# Patient Record
Sex: Female | Born: 1965 | Race: White | Hispanic: No | Marital: Married | Smoking: Never smoker
Health system: Southern US, Community
[De-identification: ages and names within clinical notes are randomized; demographics above are authoritative.]

## PROBLEM LIST (undated history)

## (undated) DIAGNOSIS — F419 Anxiety disorder, unspecified: Secondary | ICD-10-CM

## (undated) DIAGNOSIS — L509 Urticaria, unspecified: Secondary | ICD-10-CM

## (undated) DIAGNOSIS — G894 Chronic pain syndrome: Secondary | ICD-10-CM

## (undated) DIAGNOSIS — F32A Depression, unspecified: Secondary | ICD-10-CM

## (undated) DIAGNOSIS — M961 Postlaminectomy syndrome, not elsewhere classified: Secondary | ICD-10-CM

## (undated) DIAGNOSIS — J45909 Unspecified asthma, uncomplicated: Secondary | ICD-10-CM

## (undated) DIAGNOSIS — F329 Major depressive disorder, single episode, unspecified: Secondary | ICD-10-CM

## (undated) HISTORY — DX: Chronic pain syndrome: G89.4

## (undated) HISTORY — PX: ABDOMINAL HYSTERECTOMY: SHX81

## (undated) HISTORY — PX: OTHER SURGICAL HISTORY: SHX169

## (undated) HISTORY — DX: Depression, unspecified: F32.A

## (undated) HISTORY — DX: Unspecified asthma, uncomplicated: J45.909

## (undated) HISTORY — PX: POLYPECTOMY: SHX149

## (undated) HISTORY — PX: CHOLECYSTECTOMY: SHX55

## (undated) HISTORY — DX: Major depressive disorder, single episode, unspecified: F32.9

## (undated) HISTORY — PX: HERNIA REPAIR: SHX51

## (undated) HISTORY — PX: SPINE SURGERY: SHX786

## (undated) HISTORY — PX: TONSILECTOMY, ADENOIDECTOMY, BILATERAL MYRINGOTOMY AND TUBES: SHX2538

## (undated) HISTORY — PX: GASTRIC BYPASS: SHX52

## (undated) HISTORY — DX: Anxiety disorder, unspecified: F41.9

## (undated) HISTORY — DX: Postlaminectomy syndrome, not elsewhere classified: M96.1

## (undated) HISTORY — DX: Urticaria, unspecified: L50.9

---

## 1998-06-25 ENCOUNTER — Other Ambulatory Visit: Admission: RE | Admit: 1998-06-25 | Discharge: 1998-06-25 | Payer: Self-pay | Admitting: Obstetrics and Gynecology

## 1999-06-28 ENCOUNTER — Other Ambulatory Visit: Admission: RE | Admit: 1999-06-28 | Discharge: 1999-06-28 | Payer: Self-pay | Admitting: Obstetrics and Gynecology

## 2000-11-20 ENCOUNTER — Other Ambulatory Visit: Admission: RE | Admit: 2000-11-20 | Discharge: 2000-11-20 | Payer: Self-pay | Admitting: Obstetrics and Gynecology

## 2001-02-15 ENCOUNTER — Encounter: Admission: RE | Admit: 2001-02-15 | Discharge: 2001-05-16 | Payer: Self-pay | Admitting: Surgical Oncology

## 2001-04-04 ENCOUNTER — Encounter: Payer: Self-pay | Admitting: Obstetrics and Gynecology

## 2001-04-04 ENCOUNTER — Encounter: Admission: RE | Admit: 2001-04-04 | Discharge: 2001-04-04 | Payer: Self-pay | Admitting: Obstetrics and Gynecology

## 2001-04-06 ENCOUNTER — Other Ambulatory Visit: Admission: RE | Admit: 2001-04-06 | Discharge: 2001-04-06 | Payer: Self-pay | Admitting: Obstetrics and Gynecology

## 2001-04-06 ENCOUNTER — Encounter (INDEPENDENT_AMBULATORY_CARE_PROVIDER_SITE_OTHER): Payer: Self-pay

## 2004-03-02 ENCOUNTER — Other Ambulatory Visit: Admission: RE | Admit: 2004-03-02 | Discharge: 2004-03-02 | Payer: Self-pay | Admitting: Obstetrics and Gynecology

## 2005-03-03 ENCOUNTER — Other Ambulatory Visit: Admission: RE | Admit: 2005-03-03 | Discharge: 2005-03-03 | Payer: Self-pay | Admitting: Obstetrics and Gynecology

## 2005-03-16 ENCOUNTER — Ambulatory Visit (HOSPITAL_COMMUNITY): Admission: RE | Admit: 2005-03-16 | Discharge: 2005-03-16 | Payer: Self-pay | Admitting: Obstetrics and Gynecology

## 2006-06-21 ENCOUNTER — Other Ambulatory Visit: Admission: RE | Admit: 2006-06-21 | Discharge: 2006-06-21 | Payer: Self-pay | Admitting: Gynecology

## 2006-07-21 ENCOUNTER — Encounter (INDEPENDENT_AMBULATORY_CARE_PROVIDER_SITE_OTHER): Payer: Self-pay | Admitting: *Deleted

## 2006-07-21 ENCOUNTER — Ambulatory Visit (HOSPITAL_BASED_OUTPATIENT_CLINIC_OR_DEPARTMENT_OTHER): Admission: RE | Admit: 2006-07-21 | Discharge: 2006-07-21 | Payer: Self-pay | Admitting: Gynecology

## 2006-11-02 ENCOUNTER — Ambulatory Visit (HOSPITAL_COMMUNITY): Admission: RE | Admit: 2006-11-02 | Discharge: 2006-11-02 | Payer: Self-pay | Admitting: Obstetrics and Gynecology

## 2006-11-02 ENCOUNTER — Encounter (INDEPENDENT_AMBULATORY_CARE_PROVIDER_SITE_OTHER): Payer: Self-pay | Admitting: *Deleted

## 2007-02-13 ENCOUNTER — Ambulatory Visit (HOSPITAL_COMMUNITY): Admission: RE | Admit: 2007-02-13 | Discharge: 2007-02-14 | Payer: Self-pay | Admitting: Obstetrics & Gynecology

## 2007-02-13 ENCOUNTER — Encounter (INDEPENDENT_AMBULATORY_CARE_PROVIDER_SITE_OTHER): Payer: Self-pay | Admitting: Obstetrics & Gynecology

## 2008-09-06 ENCOUNTER — Encounter: Admission: RE | Admit: 2008-09-06 | Discharge: 2008-09-06 | Payer: Self-pay | Admitting: Neurosurgery

## 2008-09-11 ENCOUNTER — Ambulatory Visit (HOSPITAL_COMMUNITY): Admission: RE | Admit: 2008-09-11 | Discharge: 2008-09-12 | Payer: Self-pay | Admitting: Neurosurgery

## 2009-03-20 ENCOUNTER — Encounter: Admission: RE | Admit: 2009-03-20 | Discharge: 2009-03-20 | Payer: Self-pay | Admitting: Neurosurgery

## 2009-04-24 ENCOUNTER — Ambulatory Visit (HOSPITAL_COMMUNITY): Admission: RE | Admit: 2009-04-24 | Discharge: 2009-04-25 | Payer: Self-pay | Admitting: Neurosurgery

## 2009-07-31 ENCOUNTER — Encounter: Admission: RE | Admit: 2009-07-31 | Discharge: 2009-07-31 | Payer: Self-pay | Admitting: Family Medicine

## 2009-08-13 ENCOUNTER — Encounter: Admission: RE | Admit: 2009-08-13 | Discharge: 2009-08-13 | Payer: Self-pay | Admitting: General Surgery

## 2009-08-18 ENCOUNTER — Ambulatory Visit (HOSPITAL_COMMUNITY): Admission: RE | Admit: 2009-08-18 | Discharge: 2009-08-18 | Payer: Self-pay | Admitting: Surgery

## 2009-09-03 ENCOUNTER — Ambulatory Visit (HOSPITAL_COMMUNITY): Admission: RE | Admit: 2009-09-03 | Discharge: 2009-09-03 | Payer: Self-pay | Admitting: General Surgery

## 2009-09-07 ENCOUNTER — Ambulatory Visit (HOSPITAL_COMMUNITY): Admission: RE | Admit: 2009-09-07 | Discharge: 2009-09-07 | Payer: Self-pay | Admitting: General Surgery

## 2009-09-14 ENCOUNTER — Ambulatory Visit (HOSPITAL_COMMUNITY): Admission: RE | Admit: 2009-09-14 | Discharge: 2009-09-15 | Payer: Self-pay | Admitting: General Surgery

## 2010-01-08 ENCOUNTER — Encounter: Admission: RE | Admit: 2010-01-08 | Discharge: 2010-01-08 | Payer: Self-pay | Admitting: Neurosurgery

## 2010-01-29 ENCOUNTER — Inpatient Hospital Stay (HOSPITAL_COMMUNITY): Admission: RE | Admit: 2010-01-29 | Discharge: 2010-02-01 | Payer: Self-pay | Admitting: Neurosurgery

## 2010-12-14 LAB — CBC
MCHC: 35.1 g/dL (ref 30.0–36.0)
MCV: 90.1 fL (ref 78.0–100.0)
RDW: 13.3 % (ref 11.5–15.5)

## 2010-12-14 LAB — TYPE AND SCREEN
ABO/RH(D): A NEG
Antibody Screen: NEGATIVE

## 2010-12-27 LAB — CBC
HCT: 30.8 % — ABNORMAL LOW (ref 36.0–46.0)
Hemoglobin: 10.5 g/dL — ABNORMAL LOW (ref 12.0–15.0)
Hemoglobin: 12.5 g/dL (ref 12.0–15.0)
MCV: 88.3 fL (ref 78.0–100.0)
Platelets: 233 10*3/uL (ref 150–400)
WBC: 5.8 10*3/uL (ref 4.0–10.5)
WBC: 6.6 10*3/uL (ref 4.0–10.5)

## 2010-12-27 LAB — URINALYSIS, ROUTINE W REFLEX MICROSCOPIC
Bilirubin Urine: NEGATIVE
Glucose, UA: NEGATIVE mg/dL
Hgb urine dipstick: NEGATIVE
Nitrite: NEGATIVE
Protein, ur: NEGATIVE mg/dL
Specific Gravity, Urine: 1.036 — ABNORMAL HIGH (ref 1.005–1.030)

## 2010-12-27 LAB — COMPREHENSIVE METABOLIC PANEL
BUN: 12 mg/dL (ref 6–23)
GFR calc Af Amer: >60 mL/min (ref >60)
GFR calc non Af Amer: >60 mL/min (ref >60)
Glucose, Bld: 96 mg/dL (ref 70–99)
Potassium: 3.8 mEq/L (ref 3.5–5.1)
Total Bilirubin: 0.7 mg/dL (ref 0.3–1.2)
Total Protein: 7.4 g/dL (ref 6.0–8.3)

## 2010-12-27 LAB — DIFFERENTIAL
Basophils Absolute: 0 10*3/uL (ref 0.0–0.1)
Eosinophils Absolute: 0.2 10*3/uL (ref 0.0–0.7)
Eosinophils Relative: 3 % (ref 0–5)
Lymphocytes Relative: 36 % (ref 12–46)
Lymphs Abs: 2.4 10*3/uL (ref 0.7–4.0)
Neutrophils Relative %: 54 % (ref 43–77)

## 2010-12-29 LAB — URINE CULTURE: Colony Count: >=100000

## 2011-01-02 LAB — CBC
Hemoglobin: 13.1 g/dL (ref 12.0–15.0)
MCHC: 34.5 g/dL (ref 30.0–36.0)
MCV: 86.1 fL (ref 78.0–100.0)
Platelets: 293 10*3/uL (ref 150–400)
RDW: 15 % (ref 11.5–15.5)

## 2011-01-02 LAB — BASIC METABOLIC PANEL
CO2: 28 mEq/L (ref 19–32)
Creatinine, Ser: 0.77 mg/dL (ref 0.4–1.2)
GFR calc non Af Amer: >60 mL/min (ref >60)

## 2011-01-26 ENCOUNTER — Encounter: Payer: PRIVATE HEALTH INSURANCE | Attending: Neurosurgery | Admitting: Neurosurgery

## 2011-01-26 DIAGNOSIS — IMO0002 Reserved for concepts with insufficient information to code with codable children: Secondary | ICD-10-CM | POA: Insufficient documentation

## 2011-01-26 DIAGNOSIS — E669 Obesity, unspecified: Secondary | ICD-10-CM | POA: Insufficient documentation

## 2011-01-26 DIAGNOSIS — M1710 Unilateral primary osteoarthritis, unspecified knee: Secondary | ICD-10-CM

## 2011-01-26 DIAGNOSIS — M961 Postlaminectomy syndrome, not elsewhere classified: Secondary | ICD-10-CM | POA: Insufficient documentation

## 2011-01-26 DIAGNOSIS — R209 Unspecified disturbances of skin sensation: Secondary | ICD-10-CM | POA: Insufficient documentation

## 2011-01-26 DIAGNOSIS — G8929 Other chronic pain: Secondary | ICD-10-CM | POA: Insufficient documentation

## 2011-01-26 DIAGNOSIS — M545 Low back pain, unspecified: Secondary | ICD-10-CM | POA: Insufficient documentation

## 2011-01-26 DIAGNOSIS — Z9884 Bariatric surgery status: Secondary | ICD-10-CM | POA: Insufficient documentation

## 2011-01-26 DIAGNOSIS — Z981 Arthrodesis status: Secondary | ICD-10-CM | POA: Insufficient documentation

## 2011-01-26 DIAGNOSIS — F329 Major depressive disorder, single episode, unspecified: Secondary | ICD-10-CM | POA: Insufficient documentation

## 2011-01-26 DIAGNOSIS — M79609 Pain in unspecified limb: Secondary | ICD-10-CM | POA: Insufficient documentation

## 2011-01-26 DIAGNOSIS — F3289 Other specified depressive episodes: Secondary | ICD-10-CM | POA: Insufficient documentation

## 2011-01-27 NOTE — Group Therapy Note (Signed)
Mallory Silva is referred to [place] by Dr. Maeola Harman of [facility] after series of surgeries.  She had surgery with Dr. Venetia Maxon initially what she describes as a laminectomy in 2009, then a repeat in 2010, and then finally a fusion in May 2011.  Her fusion was L5-S1 decompression with PLIF and PLA.  Since that time, she has done well. She has continued on pain medicines through Dr. Fredrich Birks office until last month when he released her and referred her to [place] for evaluation.  The patient tells me her pain mainly is in her low back, in her left thigh, and left lateral leg and foot.  She does feel some numbness and tingling in the great toe on the left foot at this point in time.  She has been on OxyContin 40 mg twice a day, oxycodone 5 four times a day until now.  She states she would like to increase it, but I have explained to the patient that the goal would be to either back her down some or get her only dose and regimen that will be more suitable for her.  The patient describes some depression that is fairly obvious today in the interview.  She does have average pain of about 6.  All activities increase her pain.  Rest, pacing activities, and medications tend to help some.  She walks without assistance even though she does have somewhat of a limp.  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  REVIEW OF SYSTEMS:  Notable for bladder control problems, some weakness, and reported depression by the patient even though there is no clinical diagnosis.  Prior studies are available on PACK with a CT scan x-rays were done through [facility].  She had nerve studies done that she will try to obtain for [place], but she states she does not remember where they were.  Primary care doctor is Dr. Windle Guard,  Surgeon is Dr. Venetia Maxon.  PAST MEDICAL HISTORY:  Significant for hysterectomy in 2008, gastric bypass 2002, gallbladder in 2010, and 3 back  surgeries with Dr. Venetia Maxon in 2009, 2010, and a fusion in May 2011.  SOCIAL HISTORY:  She is married.  She lives with her husband.  She does not report any alcohol or tobacco use or illicit drug use.  FAMILY HISTORY:  Significant for diabetes, lung disease, and cancer.  PHYSICAL EXAMINATION:  CONSTITUTIONAL:  Appears to be within normal limits.  She is somewhat obese.  She is alert and oriented x3.  She appears a little bit depressed and mildly anxious today, but pleasant to talk to. VITAL SIGNS:  Blood pressure today was 146/90, pulse was 60, respirations were 20, and O2 saturation 97 on room air. NEUROLOGIC:  Motor strength appears to be 5/5 in the lower extremities including the iliopsoas, quadriceps, dorsiflexors, EHL, and plantar flexors.  Upper extremities are equal and 5/5 bilaterally including deltoid, biceps, triceps, intrinsics, grip.  Her sensation is intact to pinprick in the upper extremities, somewhat diminished in the left lower extremity along the lateral aspect of her leg.  Again, she can flex to about 60 degrees, extend about 10 degrees with pain in both directions.  Her reflexes are trace at the triceps, 1 at biceps, absent at the brachioradialis, 2 at the quadriceps, and trace at best at the EHL.  She is point tender to palpation along the incision line of the lumbar spine, but the incision appears well healed.  There is no redness whatsoever.  ASSESSMENT: 1. Postlaminectomy syndrome, chronic pain. 2. Depression. 3. Chronic radiculopathy in the left lower extremity.  PLAN: 1. She did undergo UDS today.  Per her request, we are going to go     ahead and obtain MRI with and without Gadolinium of the lumbar     spine just to rule out any further complication.  She states she     asked Dr. Venetia Maxon on 2 or 3 visits about possible further     investigation, but he did not feel it was warranted as he had     reviewed his office x-rays and felt like the fusion looked  good.     So, we will go ahead and do that per her request to see if we are     having a pathology to deal with there. 2. She understands that we will not issue medications today.  She has     pain medicines in residual from his office, asked about anti-     inflammatory, she cannot take those due to complications she has     had in the past. 3. We will see the patient back after the MRI, hopefully in the next 2     weeks.  If she has any difficulty in interval, she will give [place] a     call.     Mallory Silva    RLW/MedQ D:  01/26/2011 16:15:58  T:  01/27/2011 04:17:20  Job #:  [id number]

## 2011-02-03 ENCOUNTER — Other Ambulatory Visit: Payer: Self-pay | Admitting: Physical Medicine & Rehabilitation

## 2011-02-03 DIAGNOSIS — M545 Low back pain, unspecified: Secondary | ICD-10-CM

## 2011-02-03 DIAGNOSIS — M5416 Radiculopathy, lumbar region: Secondary | ICD-10-CM

## 2011-02-07 ENCOUNTER — Ambulatory Visit (HOSPITAL_COMMUNITY)
Admission: RE | Admit: 2011-02-07 | Discharge: 2011-02-07 | Disposition: A | Discharge status: Home / Self Care | Payer: PRIVATE HEALTH INSURANCE | Source: Ambulatory Visit | Attending: Physical Medicine & Rehabilitation | Admitting: Physical Medicine & Rehabilitation

## 2011-02-07 DIAGNOSIS — Z981 Arthrodesis status: Secondary | ICD-10-CM | POA: Insufficient documentation

## 2011-02-07 DIAGNOSIS — M545 Low back pain, unspecified: Secondary | ICD-10-CM

## 2011-02-07 DIAGNOSIS — IMO0001 Reserved for inherently not codable concepts without codable children: Secondary | ICD-10-CM | POA: Insufficient documentation

## 2011-02-07 DIAGNOSIS — M79609 Pain in unspecified limb: Secondary | ICD-10-CM | POA: Insufficient documentation

## 2011-02-07 DIAGNOSIS — M5416 Radiculopathy, lumbar region: Secondary | ICD-10-CM

## 2011-02-07 DIAGNOSIS — R209 Unspecified disturbances of skin sensation: Secondary | ICD-10-CM | POA: Insufficient documentation

## 2011-02-07 DIAGNOSIS — M533 Sacrococcygeal disorders, not elsewhere classified: Secondary | ICD-10-CM | POA: Insufficient documentation

## 2011-02-07 MED ORDER — GADOBENATE DIMEGLUMINE 529 MG/ML IV SOLN
20.0000 mL | Freq: Once | INTRAVENOUS | Status: AC | PRN
  Administered 2011-02-07: 20 mL via INTRAVENOUS

## 2011-02-08 NOTE — Op Note (Signed)
NAMEAUDREANA, Mallory Silva                ACCOUNT NO.:  [id number]   MEDICAL RECORD NO.:  [id number]          PATIENT TYPE:  OIB   LOCATION:  3537                         FACILITY:  [facility]   PHYSICIAN:  Danae Orleans. Venetia Maxon, M.D.  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   POSTOPERATIVE DIAGNOSES:  Left L5-S1 herniated lumbar disk with  spondylosis, degenerative disk disease, and radiculopathy.   PROCEDURE:  Left L5-S1 microdiskectomy with microdissection.   SURGEON:  Danae Orleans. Venetia Maxon, MD   ASSISTANT:  1. Georgiann Cocker, RN  2. Clydene Fake, MD   ANESTHESIA:  General endotracheal anesthesia.   ESTIMATED BLOOD LOSS:  Minimal.   COMPLICATIONS:  None.   DISPOSITION:  To recovery.   INDICATIONS:  Mallory Silva is a 45 year old woman with intractable left  leg pain.  She underwent a series of conservative treatments without  great relief of her pain.  She underwent a repeat MRI which shows that  she has a free fragment of herniated disk material directly compressing  the left S1 nerve root in addition to spondylitic disk protrusion at L5-  S1 on the left.  So, it was elected to take her to surgery for  microdiskectomy.   PROCEDURE:  Mallory Silva was brought to the operating room.  Following  satisfactory and uncomplicated induction of general endotracheal  anesthesia and placement of intravenous lines, the patient was placed in  a prone position on the Wilson frame.  Her low back was prepped and  draped in usual sterile fashion.  The area of planned incision was  infiltrated with local lidocaine.  An incision was made midline carried  through copious adipose tissue.  The lumbodorsal fascia was incised  sharply on left side of midline.  Subperiosteal dissection was performed  exposing  L5-S1 interspace.  Intraoperative x-ray confirmed correct  orientation.  A hemi-semilaminotomy of L5 was performed with high-speed  drill and completed with Kerrison rongeurs and the foraminotomy was  performed overlying the S1 nerve root.  The ligamentum flavum was then  detached and removed in piecemeal fashion.  Microscope was brought into  field.  Lateral recess was decompressed with a variety of Kerrison  rongeurs.  The S1 nerve root was decompressed as extended out the neural  foramen.  Under microdissection technique, the S1 nerve root was  mobilized medially.  There was fairly densely adherent fragment of  herniated disk material which was directly compressing the S1 nerve  root.  This fragment was removed with significant decompression of the  nerve root.  The annulus was inspected and while there was some annular  bulging, it did not appear to be causing significant nerve root  compression nor did there appeared to be a rent in the annulus with  suggestion that would be likely of recurrent disk herniation.  Therefore, the annulus was then cauterized with bipolar electrocautery  slightly to shrink it and after  a variety of ball-tip probes were placed  without any evidence of any residual nerve root compression, the wound  was irrigated and hemostasis assured.  The operative site was bathed in  Depo-Medrol and fentanyl.  The lumbodorsal fascia was reapproximated  with 0 Vicryl sutures, subcutaneous tissue were reapproximated with 2-0  Vicryl interrupted inverted sutures, and skin edges were approximated  with interrupted 3-0 Vicryl subcuticular stitch.  The wound was dressed  with Dermabond.  The patient was extubated in the operating room and  taken to the recovery in stable satisfactory condition, having tolerated  operation well.  Counts were correct at the end of the case.      Danae Orleans. Venetia Maxon, M.D.  Electronically Signed     JDS/MEDQ  D:  09/11/2008  T:  09/12/2008   Job:  [id number]

## 2011-02-08 NOTE — Op Note (Signed)
Mallory Silva, Mallory Silva                ACCOUNT NO.:  [id number]   MEDICAL RECORD NO.:  [id number]          PATIENT TYPE:  AMB   LOCATION:  DAY                          FACILITY:  [facility]   PHYSICIAN:  Genia Del, M.D.DATE OF BIRTH:  1965/11/06   DATE OF PROCEDURE:  02/13/2007  DATE OF DISCHARGE:                               OPERATIVE REPORT   PREOPERATIVE DIAGNOSIS:  Pelvic pain, dysmenorrhea, history of  endometriosis, previous abdominoplasty, gastric bypass, hernia repair,  and endometrial ablation.   POSTOPERATIVE DIAGNOSIS:  Pelvic pain, dysmenorrhea, history of  endometriosis, previous abdominoplasty, gastric bypass, hernia repair,  and endometrial ablation.   INTERVENTION:  Total laparoscopy hysterectomy assisted with the DaVinci  robot.   SURGEON:  Dr. Genia Del   ASSISTANT:  Dr. Marina Gravel.   PROCEDURE:  Under general anesthesia with endotracheal intubation the  patient is in lithotomy position.  She is prepped with Betadine on the  abdominal, suprapubic, vulvar and vaginal areas and draped as usual.  The bladder is catheterized and a Foley is put in place. We now a  gynecologic exam under general anesthesia revealing an anteverted  uterus, normal volume, mobile, no adnexal mass.  We put the Rumi with  the co-ring vaginally without any difficulty.  An 8 cm Rumi was used.  We then go abdominally.  We measure the first port at 22 cm from the  symphysis pubis.  This happened to be right under umbilicus that was  made after the abdominoplasty. We infiltrate the subcutaneous tissue  with Marcaine one-quarter plain.  We opened a 1.5 cm incision with the  scalpel.  We then opened the aponeurosis with the Mayo scissors and the  parietal peritoneum bluntly with a finger. A pursestring stitch of  Vicryl 0 is done on the aponeurosis.  We then assure that no adhesions  are present at that level. Right above the incision the omentum was  adherent to the anterior  abdominal wall.  We entered the [place] with the  camera at that level.  We visualized the abdominopelvic cavity.  Adhesions are in the per abdomen.  No adhesion is present in the pelvis.  We therefore can proceed with the other ports. The pneumoperitoneum was  created with CO2.  We measure all ports.  We then infiltrated the  subcutaneous tissue with Marcaine one-quarter plain.  We make incisions  with a scalpel and we entered the three robotic trocars and the  assistant trocars.  We then dock the robot as usual without difficulty  and we put the instruments in place which are an EndoShears scissors,  the bipolar fenestrated clamp, and the Cobra clamp.  We then go to the  console and proceed with the robotic assisted hysterectomy.  We  visualize the left adnexa and the ovary is normal to inspection.  The  tube as well.  The left ureter is in normal anatomic location.  We  cauterized and sectioned the left round ligament.  The left tube and the  left utero-ovarian ligament.  We go down the left lateral side of the  uterus, opened the visceral  peritoneum anteriorly and reclined the  bladder downward.  We visualize the left uterine artery and cauterized  it.  We then proceed exactly the same way on the right side.  We lower  the bladder to the vagina and clear the co-ring anteriorly.  We then  cauterize and sectioned the right uterine artery.  We go back to the  left side and cauterize and section the left uterine artery.  We then  are ready to open the upper vagina circumferentially around the coring.  We used the Endo shear scissor point to achieve that. When necessary and  the bipolar clamp is used prior to scissors. We detached the uterus  completely from the vagina.  We preserved the uterosacral ligaments on  the vagina to support it. We removed the uterus vaginally and leave it  about midway in the vagina to preserve the pneumoperitoneum.  We verify  hemostasis and it is adequate at all  levels.  We changed the instrument  to two needle drivers and put in the bipolar fenestrated clamp in the  fourth arm of the robot.  We used a Vicryl 0 6 inches to make figure-of-  eights to close the vagina.  It is closed completely.  Hemostasis is  adequate.  We irrigate and suction the abdominopelvic cavity.  We then  removed all instruments.  We undocked the robot.  We go laparoscopically  the after removing some of the Trendelenburg, minimal fluid is present.  We remove all the instruments and the trocar after direct vision.  We  then close the pursestring stitch at the camera port and on the  aponeurosis and we close all skin incisions with a Vicryl 4-0 in a  subcuticular suture.  We then put Dermabond on all incisions.  The  specimen is removed from the vagina and sent to pathology. It consists  of the uterus with tubes and cervix. The estimated blood loss was 100  mL.  No complication occurred.  The count of instruments and sponges was  complete.  The patient received Flagyl and Cipro IV at induction.  She  was brought to recovery room in good stable status.      Genia Del, M.D.  Electronically Signed     ML/MEDQ  D:  02/13/2007  T:  02/13/2007  Job:  [id number]

## 2011-02-08 NOTE — Op Note (Signed)
Mallory Silva, Mallory Silva                ACCOUNT NO.:  [id number]   MEDICAL RECORD NO.:  [id number]          PATIENT TYPE:  OIB   LOCATION:  3528                         FACILITY:  [facility]   PHYSICIAN:  Danae Orleans. Venetia Maxon, M.D.  DATE OF BIRTH:  1966/07/04   DATE OF PROCEDURE:  04/24/2009  DATE OF DISCHARGE:                               OPERATIVE REPORT   PREOPERATIVE DIAGNOSIS:  Recurrent herniated lumbar disk at L5-S1 of the  left with spondylosis, degenerative disk disease, and radiculopathy.   POSTOPERATIVE DIAGNOSIS:  Recurrent herniated lumbar disk at L5-S1 of  the left with spondylosis, degenerative disk disease, and radiculopathy.   PROCEDURE:  Redo left L5-S1 microdiskectomy with microdissection.   SURGEON:  Danae Orleans. Venetia Maxon, MD   ASSISTANT:  Hilda Lias, MD   ANESTHESIA:  General endotracheal anesthesia.   ESTIMATED BLOOD LOSS:  Minimal.   COMPLICATIONS:  None.   DISPOSITION:  Recovery.   INDICATIONS:  [name] is a 45 year old woman with recurrent disk  herniation at L5-S1 on the left.  She has left S1 radiculopathy.  It was  elected to take her to the Surgery for redo diskectomy.   PROCEDURE:  [name] was brought to the operating room.  Following  satisfactory, uncomplicated induction of general endotracheal  anesthesia, and placement of intravenous lines, the patient was placed  in the prone position on Wilson frame.  Her low back was prepped and  draped in usual sterile fashion.  The area of planned incision was  infiltrated with local lidocaine and previous incision was carried  through copious adipose tissue.  The lumbodorsal fascia was incised on  left side of midline.  The subperiosteal dissection was performed  exposing the previous laminotomy defect at the L5-S1 level and the  intraoperative x-ray confirmed correct orientation.  Subsequently, a  thin edge of upper aspect of the lamina and medial facet were then  removed with high-speed drill and bone  removal was completed with  Kerrison rongeur and then under the operating microscope, the S1 nerve  root was identified.  Edges of thecal sac was identified and the  interspace was identified.  There was disk material, which was causing  deflection of the S1 nerve root dorsally and using microdissection  technique, this was mobilized and disk material was removed.  The  interspace was entered and cleared of residual loose disk material as  well as the medial aspect and the lateral foraminal aspect.  Osteophyte  tool was used to remove hypertrophied material along the inferior aspect  of L5.  The S1 nerve root and thecal sac were felt to be well  decompressed.  There was no evidence of residual disk material.  The  wound was irrigated, then bathed in Depo-Medrol and fentanyl.  The self-  retaining retractor was removed.  There was excellent hemostasis.  The  lumbodorsal fascia was closed with 0 Vicryl sutures, subcutaneous  tissues were reapproximated with 2-0 Vicryl and interrupted inverted  sutures and the skin edges were  reapproximated with interrupted 3-0 Vicryl subcuticular stitch.  The  wound was dressed with Dermabond.  The patient was extubated in the  operating room and taken to recovery room in stable and satisfactory  condition having tolerated the operation well.  Counts were correct at  the end of the case.      Danae Orleans. Venetia Maxon, M.D.  Electronically Signed     JDS/MEDQ  D:  04/24/2009  T:  04/24/2009  Job:  [id number]

## 2011-02-11 NOTE — Op Note (Signed)
Mallory Silva, Mallory Silva                ACCOUNT NO.:  [id number]   MEDICAL RECORD NO.:  [id number]          PATIENT TYPE:  AMB   LOCATION:  [facility]C                         [facility]ACILITY:  [facility]   PHYSICIAN:  Mallory Silva, M.D. DATE OF BIRTH:  1966-06-04   DATE OF PROCEDURE:  DATE OF DISCHARGE:                                 OPERATIVE REPORT   PREOPERATIVE DIAGNOSIS:  Endometrial polyps with abnormal uterine bleeding.   POSTOPERATIVE DIAGNOSIS:  Endometrial polyps with abnormal uterine bleeding.   PROCEDURE:  1. Hysteroscopy.  2. Resection of endometrial polyps.  3. Total endometrial resection for ablation plus VaporTrode.   ANESTHESIA:  MAC and local.   SPECIMENS:  Endometrial resection and endometrial polyp.   DESCRIPTION OF PROCEDURE:  Under excellent anesthesia as above, with the  patient prepped and draped in lithotomy position in Pierre Part stirrups, the  cervix was grasped with a single tooth tenaculum, pulled down into view.  The cervix was then progressively dilated with a series of flat dilators to  accommodate the 7 mm resectoscope.  The cavity was then examined by  resectoscope and photographs taken of the endometrial polyps.  The polyps  were initially resected.  The entire endometrial cavity was then resected so  as to remove all of the  endometrial tissue.  Once the cavity was resected  down 5 mm or so into the myometrial and there was minimal bleeding, the  VaporTrode electrode was applied and the entire cavity treated by VaporTrode  so as to eliminate any residual endometrial tissue.  At this point, the  procedure was terminated without complication, minimal residual bleeding at  reduced pressure.           ______________________________  Mallory Silva, M.D.     CWL/MEDQ  D:  07/21/2006  T:  07/22/2006  Job:  [id number]   cc:   Mallory Silva, M.D.  Fax: [phone]   Mallory Silva, M.D.  Fax: [phone]

## 2011-02-11 NOTE — Op Note (Signed)
Mallory Silva, Mallory Silva                ACCOUNT NO.:  [id number]   MEDICAL RECORD NO.:  [id number]          PATIENT TYPE:  AMB   LOCATION:  SDC                           FACILITY:  [facility]   PHYSICIAN:  Richardean Sale, M.D.   DATE OF BIRTH:  08/01/1966   DATE OF PROCEDURE:  11/02/2006  DATE OF DISCHARGE:                               OPERATIVE REPORT   PREOPERATIVE DIAGNOSIS:  Cervical stenosis, hematometria.   POSTOPERATIVE DIAGNOSIS:  Cervical stenosis, hematometria.   OPERATION/PROCEDURE:  1. Dilatation of the cervix.  2. Dilatation and curettage.   SURGEON:  Richardean Sale, M.D.   ASSISTANT:  None.   ANESTHESIA:  Conscious sedation and paracervical block.   COMPLICATIONS:  None.   ESTIMATED BLOOD LOSS:  Minimal.   OPERATIVE FINDINGS:  Marked cervical stenosis, moderate amount of old  blood and scar tissue from the prior endometrial ablation.   SPECIMENS:  Endometrial curettings to pathology.   INDICATIONS:  This is a 45 year old white female who is status post  hysteroscopy polypectomy, dilatation and curettage, and NovaSure in  November 2007.  Since that time has had no vaginal bleeding and has had  progressively worsening cyclic pelvic pain. She underwent an ultrasound  which showed an enlarged thickened endometrial stripe and on examination  in the office cervix could not be dilated.  Her symptoms and physical  examination findings are consistent with hematometria and cervical  stenosis.  The patient has been counseled no her options.  She has had  multiple abdominal surgeries.  At this point I recommended proceeding  with cervical dilation and dilatation and curettage instead of  proceeding directly to hysterectomy given her multiple abdominal  procedures.  The patient voiced understanding of the above.  The risks  of hemorrhage, infection, uterine perforation requiring emergency  surgery, anesthesia related risks, and DVT all were discussed with the  patient in detail.   The patient voiced understanding and desires to  proceed.  Informed consent was obtained before proceeding to the OR.   DESCRIPTION OF PROCEDURE:  The patient was taken to the operating room  where she was given conscious sedation.  She was then prepped and draped  in the usual sterile fashion with Betadine and was put in the dorsal  lithotomy position and a rubber catheter was then used to drain the  bladder.  The cervix was then visualized with the speculum and the  cervix was grasped with the single-tooth tenaculum.  Paracervical block  was administered using a total of 20 mL of 1% plain Nesacaine.  Using  the pediatric dilators, the cervix was then very carefully dilated up to  the #25 Hegar dilator at which time during this process, a moderate  amount of old dark menstrual blood was produced from the os.  This was  followed by a sharp curettage.  The endometrial surface was irregular  and the specimen was gritty.  Once a gritty texture was confirmed in all  four quadrants, curettage was completed and the specimen was sent to  pathology.  At this point the single-tooth tenaculum was removed.  The  tenaculum site was cauterized with silver nitrate and once hemostatic,  the procedure was then terminated. All instruments were removed from the  patient's vagina.  bimanual exam was performed, the uterus was small,  midline, mobile, with no obvious masses.  At this point the patient was  reversed from anesthesia, was taken out of the dorsal lithotomy position  and transferred to the recovery room awake and in stable condition.  There were no complications.  All sponge, lap, needle and instrument  counts were correct x 2.      Richardean Sale, M.D.  Electronically Signed     JW/MEDQ  D:  11/02/2006  T:  11/02/2006  Job:  [id number]

## 2011-02-18 ENCOUNTER — Ambulatory Visit: Payer: PRIVATE HEALTH INSURANCE | Admitting: Physical Medicine & Rehabilitation

## 2011-02-18 DIAGNOSIS — M961 Postlaminectomy syndrome, not elsewhere classified: Secondary | ICD-10-CM

## 2011-02-19 NOTE — Assessment & Plan Note (Signed)
Ms. Chuck was seen by me on Jan 26, 2011.  She was referred over by Dr. Venetia Maxon, [facility].  She has had series of lumbar surgeries with him approximately three and describes that after the fusion in May 2011, her right radicular pain has continued to worsen, so she is referred to [place] for pain management.  The patient did obtain an MRI that basically shows paracervical changes with no compression in the nerve roots in the lower back.  She now rates her pain as 5-7 sharp and burning pain with stabbing and aching sensation, most all activities aggravate, rest and medication tend to help mobility.  She walks without assistance.  She drives and climb steps.  Functionally, she is unemployed.  She is a Hydrologist.  REVIEW OF SYSTEMS:  Notable for those difficulties describe above as well as some bladder control problems and some depression.  PAST MEDICAL HISTORY:  Unchanged from previous.  SOCIAL HISTORY:  She is married, lives with husband.  FAMILY HISTORY:  Unchanged.  PHYSICAL EXAMINATION:  Blood pressure 153/70, pulse 95, respirations 18, O2 sat 100% on room air.  Motor strength is 5/5 in lower extremities. Sensation is intact.  She walks with a slight limp to the left. Constitutionally, she is somewhat obese.  She is alert and oriented x3. Her affect is bright and alert but somewhat depressed.  ASSESSMENT: 1. Post laminectomy syndrome. 2. Chronic pain. 3. Depression.  PLAN:  I spoke things over with Dr. Wynn Banker.  I am going to go ahead and start on: 1. MS Contin CR 15 mg 1 p.o. b.i.d. 60 with no refill. 2. Oxycodone 10 mg, increase one p.o. t.i.d. #90 with no refill. 3. We are going to start her up with Dr. Wynn Banker for right side 5:1     translaminar ESI.  She is in agreement with this.  We will follow     her up after the steroid injection and see how she has done.     Caiden Monsivais L. Blima Dessert Electronically Signed    RLW/MedQ D:  02/18/2011 14:59:02   T:  02/19/2011 04:35:07  Job #:  [id number]

## 2011-03-17 ENCOUNTER — Ambulatory Visit: Payer: PRIVATE HEALTH INSURANCE | Admitting: Physical Medicine & Rehabilitation

## 2011-03-17 ENCOUNTER — Encounter: Payer: PRIVATE HEALTH INSURANCE | Attending: Neurosurgery

## 2011-03-17 DIAGNOSIS — G8929 Other chronic pain: Secondary | ICD-10-CM | POA: Insufficient documentation

## 2011-03-17 DIAGNOSIS — F3289 Other specified depressive episodes: Secondary | ICD-10-CM | POA: Insufficient documentation

## 2011-03-17 DIAGNOSIS — M79609 Pain in unspecified limb: Secondary | ICD-10-CM | POA: Insufficient documentation

## 2011-03-17 DIAGNOSIS — M961 Postlaminectomy syndrome, not elsewhere classified: Secondary | ICD-10-CM | POA: Insufficient documentation

## 2011-03-17 DIAGNOSIS — Z9884 Bariatric surgery status: Secondary | ICD-10-CM | POA: Insufficient documentation

## 2011-03-17 DIAGNOSIS — Z981 Arthrodesis status: Secondary | ICD-10-CM | POA: Insufficient documentation

## 2011-03-17 DIAGNOSIS — N9089 Other specified noninflammatory disorders of vulva and perineum: Secondary | ICD-10-CM

## 2011-03-17 DIAGNOSIS — E669 Obesity, unspecified: Secondary | ICD-10-CM | POA: Insufficient documentation

## 2011-03-17 DIAGNOSIS — F329 Major depressive disorder, single episode, unspecified: Secondary | ICD-10-CM | POA: Insufficient documentation

## 2011-03-17 DIAGNOSIS — M545 Low back pain, unspecified: Secondary | ICD-10-CM | POA: Insufficient documentation

## 2011-03-17 DIAGNOSIS — R209 Unspecified disturbances of skin sensation: Secondary | ICD-10-CM | POA: Insufficient documentation

## 2011-03-17 DIAGNOSIS — IMO0002 Reserved for concepts with insufficient information to code with codable children: Secondary | ICD-10-CM | POA: Insufficient documentation

## 2011-03-17 NOTE — Procedures (Signed)
NAMECESIA, ORF                ACCOUNT NO.:  [id number]  MEDICAL RECORD NO.:  [id number]           PATIENT TYPE:  O  LOCATION:  [facility]                          FACILITY:  [facility]  PHYSICIAN:  Erick Colace, M.D.DATE OF BIRTH:  [birth year]  DATE OF PROCEDURE:  03/17/2011 DATE OF DISCHARGE:                              OPERATIVE REPORT  PROCEDURE:  Left S1 transforaminal lumbar epidural steroid injection under fluoroscopic guidance.  INDICATION:  Lumbar and sciatic type pain going into the left little toe.  Informed consent was obtained after describing risks and benefits.  Pain is only partially response to medication management including narcotic analgesics, interferes with self-care and mobility.  Informed consent was obtained after scribing risks and benefits of the procedure with the patient.  These include bleeding, bruising, and infection.  She elects to proceed and has given written consent.  The patient was placed prone on fluoroscopy table.  Betadine prep, sterile drape 25-gauge inch and half needle was used to anesthetize the skin and subcutaneous tissue 1% lidocaine x2 mL.  A 22-gauge 3-1/2 inch spinal needle was inserted under fluoroscopic guidance into left S1 foramen, AP, lateral, and oblique imaging utilized.  Omnipaque 180 under live fluoro demonstrated good epidural nerve root spread followed by injection of 1 mL and 10 mg/mL dexamethasone and 2 mL of 1% lidocaine.  The patient tolerated the procedure well.  Postprocedure instructions given.  Followup in 1 month with Lauree Chandler and 2 months with myself  for possible reinjection.     Erick Colace, M.D. Electronically Signed    AEK/MEDQ  D:  03/17/2011 10:16:27  T:  03/17/2011 15:55:03  Job:  [id number]

## 2011-04-06 ENCOUNTER — Encounter: Payer: PRIVATE HEALTH INSURANCE | Attending: Neurosurgery | Admitting: Neurosurgery

## 2011-04-06 DIAGNOSIS — Z9884 Bariatric surgery status: Secondary | ICD-10-CM | POA: Insufficient documentation

## 2011-04-06 DIAGNOSIS — M961 Postlaminectomy syndrome, not elsewhere classified: Secondary | ICD-10-CM | POA: Insufficient documentation

## 2011-04-06 DIAGNOSIS — F329 Major depressive disorder, single episode, unspecified: Secondary | ICD-10-CM

## 2011-04-06 DIAGNOSIS — E669 Obesity, unspecified: Secondary | ICD-10-CM | POA: Insufficient documentation

## 2011-04-06 DIAGNOSIS — R209 Unspecified disturbances of skin sensation: Secondary | ICD-10-CM | POA: Insufficient documentation

## 2011-04-06 DIAGNOSIS — G8929 Other chronic pain: Secondary | ICD-10-CM | POA: Insufficient documentation

## 2011-04-06 DIAGNOSIS — F3289 Other specified depressive episodes: Secondary | ICD-10-CM | POA: Insufficient documentation

## 2011-04-06 DIAGNOSIS — IMO0002 Reserved for concepts with insufficient information to code with codable children: Secondary | ICD-10-CM | POA: Insufficient documentation

## 2011-04-06 DIAGNOSIS — M545 Low back pain, unspecified: Secondary | ICD-10-CM | POA: Insufficient documentation

## 2011-04-06 DIAGNOSIS — M79609 Pain in unspecified limb: Secondary | ICD-10-CM | POA: Insufficient documentation

## 2011-04-06 DIAGNOSIS — G894 Chronic pain syndrome: Secondary | ICD-10-CM

## 2011-04-06 DIAGNOSIS — Z981 Arthrodesis status: Secondary | ICD-10-CM | POA: Insufficient documentation

## 2011-04-06 NOTE — Assessment & Plan Note (Signed)
Mallory Silva returns today for complaints for low back pain.  She states that she feels like she did some better after her last visit with Dr. Wynn Banker.  She went to the beach and "overdid things" and since she has been back, she has been doing more than normal.  She rates her average pain at 5.  General activity level is 6 or 7.  Sleep patterns are good. Pain is the same 24 hours a day.  Today, she does not bring her bottles with her and she states she ran out 10 days earlier and has not followed with any body but her own.  She states that she did call, which is verified in the chart about 2 weeks ago telling us that she was out of medicines due to the fact she had overtaken.  Dr. Wynn Banker agreed with me and we did not refill her early.  She was warned today that she will be discharged from the clinic if she shorts her pills again or does not bring her bottles back and she understands this.  REVIEW OF SYSTEMS:  Notable for difficulties described above, otherwise within normal limits.  She walks without assistance.  She can walk about 20 to 30 minutes at a time.  PAST MEDICAL HISTORY:  Unchanged.  SOCIAL HISTORY:  Married.  Lives with her husband.  PHYSICAL EXAMINATION:  Her blood pressure is 140/85, pulse 85, respirations 20, O2 sats 97 on room air.  Oswestry score is 46.  Her sensation is intact.  Strength is good in lower extremities.  She did well with another injection or the last injection.  Constitutionally, she is within normal limits.  She is alert and oriented x3.  Her gait is normal.  ASSESSMENT:  Chronic low back pain status post fusion with Dr. Venetia Maxon.  PLAN:  I refilled her oxycodone 10 mg one p.o. q.i.d. 120 with no refill.  Again the patient was warned she will be discharged if she shorts her pills again or comes in without her bottle.  She will follow up here in 1 month.  Her questions were encouraged and answered.     Jaque Dacy L. Blima Dessert Electronically  Signed    RLW/MedQ D:  04/06/2011 09:22:21  T:  04/06/2011 10:48:32  Job #:  [id number]

## 2011-04-07 ENCOUNTER — Ambulatory Visit: Payer: PRIVATE HEALTH INSURANCE | Admitting: Neurosurgery

## 2011-05-10 ENCOUNTER — Encounter: Payer: PRIVATE HEALTH INSURANCE | Attending: Neurosurgery

## 2011-05-10 ENCOUNTER — Ambulatory Visit: Payer: PRIVATE HEALTH INSURANCE | Admitting: Physical Medicine & Rehabilitation

## 2011-05-10 DIAGNOSIS — M961 Postlaminectomy syndrome, not elsewhere classified: Secondary | ICD-10-CM | POA: Insufficient documentation

## 2011-05-10 DIAGNOSIS — M545 Low back pain, unspecified: Secondary | ICD-10-CM | POA: Insufficient documentation

## 2011-05-10 DIAGNOSIS — F3289 Other specified depressive episodes: Secondary | ICD-10-CM | POA: Insufficient documentation

## 2011-05-10 DIAGNOSIS — M79609 Pain in unspecified limb: Secondary | ICD-10-CM | POA: Insufficient documentation

## 2011-05-10 DIAGNOSIS — F329 Major depressive disorder, single episode, unspecified: Secondary | ICD-10-CM | POA: Insufficient documentation

## 2011-05-10 DIAGNOSIS — R209 Unspecified disturbances of skin sensation: Secondary | ICD-10-CM | POA: Insufficient documentation

## 2011-05-10 DIAGNOSIS — Z981 Arthrodesis status: Secondary | ICD-10-CM | POA: Insufficient documentation

## 2011-05-10 DIAGNOSIS — IMO0002 Reserved for concepts with insufficient information to code with codable children: Secondary | ICD-10-CM | POA: Insufficient documentation

## 2011-05-10 DIAGNOSIS — G8929 Other chronic pain: Secondary | ICD-10-CM | POA: Insufficient documentation

## 2011-05-10 DIAGNOSIS — Z9884 Bariatric surgery status: Secondary | ICD-10-CM | POA: Insufficient documentation

## 2011-05-10 DIAGNOSIS — E669 Obesity, unspecified: Secondary | ICD-10-CM | POA: Insufficient documentation

## 2011-05-10 DIAGNOSIS — M5430 Sciatica, unspecified side: Secondary | ICD-10-CM

## 2011-05-11 NOTE — Assessment & Plan Note (Signed)
ACCOUNT:  [id number].  This is a patient of Dr. Wynn Banker that I have seen several times for her complaints of low back pain.  He did an injection of late June, epidural steroid injection late June, and she has done well with that. She states she was able to travel to [place] to see her son who is at [facility] and had little in the way of aggravation of her pain.  She rates her pain today of 2-5 sharp burning pain that is dull in sensation.  Activity level is 5-6.  Pain is worse during the day. Sleep patterns are fair.  Pain is worse with walking, bending, standing, and some activity.  Rest and medication tend to help.  She walks without assistance.  She climb steps and drive.  She can walk up 30 minutes at a time now.  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  Does well with her ADLs.  REVIEW OF SYSTEMS:  Notable for difficulties as described above as well as some weakness and trouble with ambulation, but overall well. Oswestry score is 46.  There is no signs of aberrant behavior, suicidal thoughts.  PAST MEDICAL HISTORY:  Unchanged.  SOCIAL HISTORY:  She is married, lives with her husband.  FAMILY HISTORY:  Unchanged.  PHYSICAL EXAM:  VITAL SIGNS:  Blood pressure is 142/86, pulse 93, respirations 16, O2 sats 100 on room air. NEUROLOGIC:  Motor strength is 5/5 in the lower extremities.  Sensation is intact.  Constitutionally, she is within normal limits.  She is alert and oriented x3.  She has normal gait.  ASSESSMENT:  Chronic low back pain, status post fusion.  PLAN:  Refill oxycodone 10 mg one p.o. q.i.d., #120 with no refill.  She is taking her gabapentin as prescribed and doing well with that she is in agreement with her plan and she will see her back in 1 month.     Mallory Silva Electronically Signed    RLW/MedQ D:  05/10/2011 16:10:96  T:  05/10/2011 15:00:38  Job #:  [id number]

## 2011-06-09 ENCOUNTER — Ambulatory Visit: Payer: PRIVATE HEALTH INSURANCE | Admitting: Physical Medicine & Rehabilitation

## 2011-06-09 ENCOUNTER — Ambulatory Visit: Payer: PRIVATE HEALTH INSURANCE | Admitting: Neurosurgery

## 2011-06-09 ENCOUNTER — Encounter: Payer: PRIVATE HEALTH INSURANCE | Attending: Neurosurgery

## 2011-06-09 DIAGNOSIS — R209 Unspecified disturbances of skin sensation: Secondary | ICD-10-CM | POA: Insufficient documentation

## 2011-06-09 DIAGNOSIS — Z981 Arthrodesis status: Secondary | ICD-10-CM | POA: Insufficient documentation

## 2011-06-09 DIAGNOSIS — M545 Low back pain, unspecified: Secondary | ICD-10-CM | POA: Insufficient documentation

## 2011-06-09 DIAGNOSIS — F3289 Other specified depressive episodes: Secondary | ICD-10-CM | POA: Insufficient documentation

## 2011-06-09 DIAGNOSIS — IMO0002 Reserved for concepts with insufficient information to code with codable children: Secondary | ICD-10-CM

## 2011-06-09 DIAGNOSIS — M961 Postlaminectomy syndrome, not elsewhere classified: Secondary | ICD-10-CM | POA: Insufficient documentation

## 2011-06-09 DIAGNOSIS — G8929 Other chronic pain: Secondary | ICD-10-CM | POA: Insufficient documentation

## 2011-06-09 DIAGNOSIS — E669 Obesity, unspecified: Secondary | ICD-10-CM | POA: Insufficient documentation

## 2011-06-09 DIAGNOSIS — F329 Major depressive disorder, single episode, unspecified: Secondary | ICD-10-CM | POA: Insufficient documentation

## 2011-06-09 DIAGNOSIS — Z9884 Bariatric surgery status: Secondary | ICD-10-CM | POA: Insufficient documentation

## 2011-06-09 DIAGNOSIS — M79609 Pain in unspecified limb: Secondary | ICD-10-CM | POA: Insufficient documentation

## 2011-06-09 NOTE — Procedures (Signed)
Mallory Silva, Mallory Silva                ACCOUNT NO.:  [id number]  MEDICAL RECORD NO.:  [id number]           PATIENT TYPE:  O  LOCATION:  [facility]                          FACILITY:  [facility]  PHYSICIAN:  Erick Colace, M.D.DATE OF BIRTH:  Jan 28, 1966  DATE OF PROCEDURE: DATE OF DISCHARGE:                              OPERATIVE REPORT  REASON FOR VISIT:  Back pain and left lower extremity pain.  PROCEDURE:  Left S1 transforaminal lumbar epidural steroid injection under fluoroscopic guidance.  Informed consent was obtained after describing risks and benefits of the procedure with the patient.  These include bleeding, bruising and infection.  She elects to proceed and has given written consent.  The patient placed prone on fluoroscopy table.  Betadine prep, sterile drape, 25-gauge inch and half needle was used to anesthetize the skin and subcu tissue 1% lidocaine x2 mL.  Then a 22-gauge, 3-1/2-inch spinal needle was inserted under fluoroscopic guidance in the left S1 foramen. AP, lateral and oblique images utilized.  Omnipaque 180 under live fluoro demonstrated no intravascular uptake and good epidural spread followed by injection of 1 mL of 10 mg/mL dexamethasone and 2 mL of 1% lidocaine.  The patient tolerated the procedure well.  Postprocedure instructions given.  I will see her back in 3 months for repeat procedure last one performed on March 17, 2011.  She had a duration of effect of 2-1/2 months.  She will follow up with Lauree Chandler next month.     Erick Colace, M.D. Electronically Signed    AEK/MEDQ  D:  06/09/2011 11:10:06  T:  06/09/2011 11:53:53  Job:  [id number]

## 2011-06-09 NOTE — Procedures (Signed)
Mallory Silva, Mallory Silva                ACCOUNT NO.:  [id number]  MEDICAL RECORD NO.:  [id number]           PATIENT TYPE:  O  LOCATION:  [facility]                          FACILITY:  [facility]  PHYSICIAN:  Erick Colace, M.D.DATE OF BIRTH:  Feb 18, 1966  DATE OF PROCEDURE: DATE OF DISCHARGE:                              OPERATIVE REPORT  ADDENDUM  Time-out taken prior to the procedure to ensure proper patient and proper procedure.  Note that the patient was not undergoing general, local anesthesia only.     Erick Colace, M.D. Electronically Signed    AEK/MEDQ  D:  06/09/2011 11:10:50  T:  06/09/2011 13:31:31  Job:  [id number]

## 2011-07-01 LAB — CBC
HCT: 36.3 % (ref 36.0–46.0)
MCHC: 33.6 g/dL (ref 30.0–36.0)
MCV: 84.3 fL (ref 78.0–100.0)
RBC: 4.31 MIL/uL (ref 3.87–5.11)
WBC: 7.9 10*3/uL (ref 4.0–10.5)

## 2011-07-01 LAB — BASIC METABOLIC PANEL
BUN: 10 mg/dL (ref 6–23)
CO2: 27 mEq/L (ref 19–32)
Chloride: 104 mEq/L (ref 96–112)
Creatinine, Ser: 0.76 mg/dL (ref 0.4–1.2)
GFR calc Af Amer: >60 mL/min (ref >60)
Potassium: 4.3 mEq/L (ref 3.5–5.1)

## 2011-07-06 ENCOUNTER — Encounter: Payer: PRIVATE HEALTH INSURANCE | Attending: Neurosurgery | Admitting: Neurosurgery

## 2011-07-06 DIAGNOSIS — M545 Low back pain, unspecified: Secondary | ICD-10-CM | POA: Insufficient documentation

## 2011-07-06 DIAGNOSIS — Z981 Arthrodesis status: Secondary | ICD-10-CM | POA: Insufficient documentation

## 2011-07-06 DIAGNOSIS — M79609 Pain in unspecified limb: Secondary | ICD-10-CM | POA: Insufficient documentation

## 2011-07-06 DIAGNOSIS — G894 Chronic pain syndrome: Secondary | ICD-10-CM

## 2011-07-06 NOTE — Assessment & Plan Note (Signed)
This is the patient of Dr. Wynn Banker, seen for chronic low back pain. She states her last epidural steroid injection last month did not help her as much as the first one did.  She is quite disappointment that. She still has some pain in her left lower extremity.  It is fairly unchanged.  Her average pain is about 5.  It is sharp, burning, dull, stabbing, tingling, and aching pain.  General activity level is 5.  Pain is worse in the day, evening and night.  Sleep patterns are fair.  Pain is worse with walking, bending, sitting, standing and activity.  Rest, medication, and injections do tend to help.  She can walk without assistance.  She can walk about 30 minutes at a time.  She climbs steps and drives.  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.  She does need some help with household duties and shopping.  REVIEW OF SYSTEMS:  Notable for difficulties as described above as well as some weight gain, diarrhea constipation, nausea, urinary retention, limb swelling, bladder control issues, weakness, numbness, trouble walking, depression anxiety.  No suicidal thoughts or aberrant behaviors.  Last UDS consistent, pill counts consistent.  PAST MEDICAL HISTORY, SOCIAL HISTORY, AND FAMILY HISTORY: Unchanged.  PHYSICAL EXAMINATION:  Her blood pressure is 133/77, pulse is 110, respirations 16, O2 sats 100 on room air.  Motor strength IS 5/5 in the iliopsoas quadriceps bilaterally.  Sensation is intact. Constitutionally, she is obese.  She is alert and oriented x3.  She has a slight limp to her gait.  ASSESSMENT:  Chronic low back pain status post fusion.  PLAN: 1. Refill oxycodone 10 mg 1 p.o. q.i.d., 120 with no refill 2. Her questions were encouraged and answered.  She will follow up     here in the clinic in 1 month to discuss possible treatment options     other than injections with Dr. Wynn Banker at her request.     Perlie Gold L. Blima Dessert Electronically Signed    RLW/MedQ D:  07/06/2011 10:24:52  T:  07/06/2011 11:30:23  Job #:  [id number]

## 2011-08-02 ENCOUNTER — Encounter: Payer: PRIVATE HEALTH INSURANCE | Attending: Neurosurgery

## 2011-08-02 ENCOUNTER — Ambulatory Visit: Payer: PRIVATE HEALTH INSURANCE | Admitting: Physical Medicine & Rehabilitation

## 2011-08-02 DIAGNOSIS — M545 Low back pain, unspecified: Secondary | ICD-10-CM | POA: Insufficient documentation

## 2011-08-02 DIAGNOSIS — F3289 Other specified depressive episodes: Secondary | ICD-10-CM | POA: Insufficient documentation

## 2011-08-02 DIAGNOSIS — F329 Major depressive disorder, single episode, unspecified: Secondary | ICD-10-CM | POA: Insufficient documentation

## 2011-08-02 DIAGNOSIS — M25569 Pain in unspecified knee: Secondary | ICD-10-CM

## 2011-08-02 DIAGNOSIS — E669 Obesity, unspecified: Secondary | ICD-10-CM | POA: Insufficient documentation

## 2011-08-02 DIAGNOSIS — Z9884 Bariatric surgery status: Secondary | ICD-10-CM | POA: Insufficient documentation

## 2011-08-02 DIAGNOSIS — Z981 Arthrodesis status: Secondary | ICD-10-CM | POA: Insufficient documentation

## 2011-08-02 DIAGNOSIS — M79609 Pain in unspecified limb: Secondary | ICD-10-CM | POA: Insufficient documentation

## 2011-08-02 DIAGNOSIS — M961 Postlaminectomy syndrome, not elsewhere classified: Secondary | ICD-10-CM | POA: Insufficient documentation

## 2011-08-02 DIAGNOSIS — IMO0002 Reserved for concepts with insufficient information to code with codable children: Secondary | ICD-10-CM | POA: Insufficient documentation

## 2011-08-02 DIAGNOSIS — G894 Chronic pain syndrome: Secondary | ICD-10-CM

## 2011-08-02 DIAGNOSIS — R209 Unspecified disturbances of skin sensation: Secondary | ICD-10-CM | POA: Insufficient documentation

## 2011-08-02 DIAGNOSIS — G8929 Other chronic pain: Secondary | ICD-10-CM | POA: Insufficient documentation

## 2011-08-02 NOTE — Assessment & Plan Note (Signed)
This is a patient of Dr. Wynn Banker' who was on his scheduled today.  I did not see the patient due to time constraints.  The patient reports no change in her overall pain.  She does have some right knee pain.  She is scheduled to see Dr. Luiz Blare next week for an orthopedic evaluation for her right knee.  It does have quite a bit of a click with lateral testing on it today.  The pain she rates it a 5-6.  It is worse in the morning, daytime, and the evening.  Sleep patterns are fair.  Pain is worse with walking, bending, sitting, standing, and activity.  Rest and medication do help.  She walks without assistance.  She climb steps and drives.  She can walk about 20 minutes at a time.  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  She does need some help with household duties and shopping.  REVIEW OF SYSTEMS:  Notable for those difficulties as well as some numbness, depression.  No suicidal thoughts or aberrant behaviors.  Pill counts and UDS correct.  PAST MEDICAL HISTORY:  Unchanged.  SOCIAL HISTORY:  Unchanged.  FAMILY HISTORY:  Unchanged.  PHYSICAL EXAMINATION:  VITAL SIGNS:  Her blood pressure is 142/69, pulse 110, respirations 18, O2 sats 97 on room air. NEUROLOGIC:  Motor strength and sensation are intact. CONSTITUTIONAL:  She is somewhat obese.  She is alert and oriented x3. EXTREMITIES:  Slight limp to her gait due to the right knee injury. Again with confrontational testing and lateral movement in that knee, she does have a click and an obvious patella change.  ASSESSMENT: 1. Chronic low back pain status post fusion. 2. Right knee question derangement.  PLAN: 1. She will follow up with Dr. Luiz Blare for her knee next week. 2. Gabapentin 100 mg 2 p.o. t.i.d., 180 with 3 refills. 3. Oxycodone 10 mg 1 p.o. q.i.d., 120 with no refill. 4. We will try her on some Mobic 7.5 mg 1 p.o. daily with food, 30     with 2 refills.  If she tolerates that well, Dr. Luiz Blare may  bump     her to 15 mg a day.  We will see her back in a month.  Her     questions were encouraged and answered.     Bralynn Donado L. Blima Dessert Electronically Signed    RLW/MedQ D:  08/02/2011 15:29:10  T:  08/02/2011 22:16:10  Job #:  [id number]

## 2011-09-02 ENCOUNTER — Ambulatory Visit: Payer: PRIVATE HEALTH INSURANCE | Admitting: Physical Medicine & Rehabilitation

## 2011-09-02 ENCOUNTER — Encounter: Payer: PRIVATE HEALTH INSURANCE | Attending: Neurosurgery

## 2011-09-02 DIAGNOSIS — Z9884 Bariatric surgery status: Secondary | ICD-10-CM | POA: Insufficient documentation

## 2011-09-02 DIAGNOSIS — M961 Postlaminectomy syndrome, not elsewhere classified: Secondary | ICD-10-CM

## 2011-09-02 DIAGNOSIS — M79609 Pain in unspecified limb: Secondary | ICD-10-CM | POA: Insufficient documentation

## 2011-09-02 DIAGNOSIS — E669 Obesity, unspecified: Secondary | ICD-10-CM | POA: Insufficient documentation

## 2011-09-02 DIAGNOSIS — R209 Unspecified disturbances of skin sensation: Secondary | ICD-10-CM | POA: Insufficient documentation

## 2011-09-02 DIAGNOSIS — G8928 Other chronic postprocedural pain: Secondary | ICD-10-CM

## 2011-09-02 DIAGNOSIS — M25569 Pain in unspecified knee: Secondary | ICD-10-CM

## 2011-09-02 DIAGNOSIS — M545 Low back pain, unspecified: Secondary | ICD-10-CM | POA: Insufficient documentation

## 2011-09-02 DIAGNOSIS — IMO0002 Reserved for concepts with insufficient information to code with codable children: Secondary | ICD-10-CM | POA: Insufficient documentation

## 2011-09-02 DIAGNOSIS — F3289 Other specified depressive episodes: Secondary | ICD-10-CM | POA: Insufficient documentation

## 2011-09-02 DIAGNOSIS — G8929 Other chronic pain: Secondary | ICD-10-CM | POA: Insufficient documentation

## 2011-09-02 DIAGNOSIS — Z981 Arthrodesis status: Secondary | ICD-10-CM | POA: Insufficient documentation

## 2011-09-02 DIAGNOSIS — F329 Major depressive disorder, single episode, unspecified: Secondary | ICD-10-CM | POA: Insufficient documentation

## 2011-09-06 NOTE — Assessment & Plan Note (Signed)
REASON FOR VISIT:  Back pain and bilateral knee pain.  HISTORY:  A 45 year old female with a history of chronic left S1 radiculopathy, lumbar post laminectomy syndrome status post fusion by Dr. Maeola Harman.  She has been followed at our clinic since May of this year.  Her surgery was at L5-S1 in May 2011.  She had a prior laminectomy in 2009.  She had a repeat MRI on Feb 07, 2011, showing no recurrent disk protrusion, some scarring around the left S1 nerve root.  She has developed some bilateral knee pain as well.  She is planning to see Dr. Jodi Geralds.  She reports allergies to MS CONTIN, IBUPROFEN, PENICILLIN, SEPTRA and CAST.  She is on Cymbalta initially trialed on 60 mg that was too much for her and now just went back up to 60 after being on 30 for couple of months by her primary care physician, Dr. Jeannetta Nap.  Her Oswestry score has been fluctuating, in August it was 82, in September 50, in October 44.  In November, Oswestry has not been fluctuating.  Opioid consent form was signed today.  I further explained elements of this to the patient after she read it in the presence of her sister, able to ask questions.  Pain score, she indicates as a 10 currently.  She has had a bad month her mother just passed away.  She also gained 10 pounds since last month.  She was started on some Mobic, but had no other medication changes per her report.  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  She needs help with certain meal prep, household duties, and shopping, but otherwise is independent.  REVIEW OF SYSTEMS:  As above.  FAMILY HISTORY:  Lung disease, Alzheimer.  PHYSICAL EXAMINATION:  VITAL SIGNS:  Blood pressure 138/79, pulse 104, respirations 16, O2 saturation 100% on room air, weight 200 pounds, and height 5 feet 9 inches. GENERAL:  An overweight female, in no acute distress.  Mood and affect are flat, but no debility or  agitation. MUSCULOSKELETAL:  She has good neck range of motion.  Upper extremity strength is normal.  Lower extremity strength is normal.  Her deep tendon reflexes are normal except for absent left S1 with normal right S1.  Straight leg raising test is negative.  Strength is normal.  Sensory exam is nondermatomal and decreasing sensation bilaterally.  IMPRESSION: 1. Lumbar post laminectomy syndrome status post L5-S1 fusion with     chronic left S1 radiculopathy. 2. Chronic pain syndrome.  PLAN: 1. We discussed her treatment options given that she does not feel     adequate response to oxycodone, we will trial her on a long-acting     i.e. fentanyl patch 25 mcg.  Her long-term goal is to come off pain     medications and would like to do so in the future, although right     now she does not feel like she can.  We will give her 2-week supply     of the fentanyl patch.  If it is helpful for her pain, we will     continue this and then wean after the holidays to 12.5 patch for a     month. 2. Continue her gabapentin.  She states she has tried quitting taking     once but had increased pain. 3. Discussed with the patient and sister, agree with plan, and they     will be present in 2  weeks.  She is to stop Mobic.     Erick Colace, M.D. Electronically Signed    AEK/MedQ D:  09/02/2011 14:59:39  T:  09/02/2011 19:41:29  Job #:  [id number]  cc:   Harvie Junior, M.D. Fax: [phone]  Danae Orleans. Venetia Maxon, M.D. Fax: [phone]  Dr. Jeannetta Nap.

## 2011-09-16 ENCOUNTER — Encounter: Payer: PRIVATE HEALTH INSURANCE | Attending: Neurosurgery | Admitting: Neurosurgery

## 2011-09-16 DIAGNOSIS — M545 Low back pain, unspecified: Secondary | ICD-10-CM | POA: Insufficient documentation

## 2011-09-16 DIAGNOSIS — G8929 Other chronic pain: Secondary | ICD-10-CM | POA: Insufficient documentation

## 2011-09-16 DIAGNOSIS — G894 Chronic pain syndrome: Secondary | ICD-10-CM

## 2011-09-16 DIAGNOSIS — M961 Postlaminectomy syndrome, not elsewhere classified: Secondary | ICD-10-CM

## 2011-09-17 NOTE — Assessment & Plan Note (Signed)
This is a patient of Dr. Wynn Banker' seen for chronic low back pain and postlaminectomy syndrome.  We did work the patient in today.  She had an appointment.  She was 30 minutes late and I saw her in anyway.  Dr. Wynn Banker started her on 25 mcg of fentanyl last visit.  She comes in today stating she is having a lot of breakthrough pain.  She has a friend with her that is also voicing concerns about her breakthrough pain.  Her average pain is 7-8.  It is a sharp, burning, dull, stabbing, tingling, aching pain.  General activity level is 1-4.  Pain is same morning, noon, and night.  Sleep patterns are fair.  All activities aggravate.  Rest and medication help.  She walks without assistance. She can walk about 15 minutes at a time.  She climbs steps and drives. Functionally, she is unemployed.  REVIEW OF SYSTEMS:  Notable for difficulties as described above as well as some weight gain, poor appetite, paresthesias, depression, and anxiety.  No suicidal thoughts.  Last pill count and UDS consistent.  PAST MEDICAL HISTORY/SOCIAL HISTORY/FAMILY HISTORY:  Unchanged.  PHYSICAL EXAMINATION:  VITAL SIGNS:  Blood pressure is 149/94, pulse 96, respirations 16, and O2 sats 99 on room air. NEUROLOGIC:  Motor strength and sensation are intact. CONSTITUTIONAL:  She is obese.  She is alert and oriented, somewhat depressed.  Gait is normal.  ASSESSMENT:  Chronic pain and postlaminectomy syndrome.  PLAN:  We will continue the fentanyl 25 mcg 1 transdermally every 72 hours, #10 with no refill and did go back to oxycodone 5 mg 1 p.o. b.i.d. p.r.n., #60 for breakthrough pain, no more than 2 a day.  Dr. Wynn Banker will see her back in a month.     Selicia Windom L. Blima Dessert Electronically Signed    RLW/MedQ D:  09/16/2011 14:40:56  T:  09/17/2011 10:07:03  Job #:  [id number]

## 2011-10-18 ENCOUNTER — Ambulatory Visit: Payer: PRIVATE HEALTH INSURANCE | Admitting: Physical Medicine & Rehabilitation

## 2011-10-21 ENCOUNTER — Ambulatory Visit: Payer: PRIVATE HEALTH INSURANCE | Admitting: Physical Medicine & Rehabilitation

## 2011-10-25 ENCOUNTER — Encounter: Payer: PRIVATE HEALTH INSURANCE | Attending: Neurosurgery

## 2011-10-25 ENCOUNTER — Ambulatory Visit: Payer: PRIVATE HEALTH INSURANCE | Admitting: Physical Medicine & Rehabilitation

## 2011-10-25 DIAGNOSIS — F3289 Other specified depressive episodes: Secondary | ICD-10-CM | POA: Insufficient documentation

## 2011-10-25 DIAGNOSIS — M961 Postlaminectomy syndrome, not elsewhere classified: Secondary | ICD-10-CM | POA: Insufficient documentation

## 2011-10-25 DIAGNOSIS — G8929 Other chronic pain: Secondary | ICD-10-CM | POA: Insufficient documentation

## 2011-10-25 DIAGNOSIS — M545 Low back pain, unspecified: Secondary | ICD-10-CM | POA: Insufficient documentation

## 2011-10-25 DIAGNOSIS — IMO0002 Reserved for concepts with insufficient information to code with codable children: Secondary | ICD-10-CM | POA: Insufficient documentation

## 2011-10-25 DIAGNOSIS — F329 Major depressive disorder, single episode, unspecified: Secondary | ICD-10-CM | POA: Insufficient documentation

## 2011-10-25 DIAGNOSIS — R209 Unspecified disturbances of skin sensation: Secondary | ICD-10-CM | POA: Insufficient documentation

## 2011-10-25 DIAGNOSIS — M79609 Pain in unspecified limb: Secondary | ICD-10-CM | POA: Insufficient documentation

## 2011-10-25 DIAGNOSIS — E669 Obesity, unspecified: Secondary | ICD-10-CM | POA: Insufficient documentation

## 2011-10-25 DIAGNOSIS — Z981 Arthrodesis status: Secondary | ICD-10-CM | POA: Insufficient documentation

## 2011-10-25 DIAGNOSIS — Z9884 Bariatric surgery status: Secondary | ICD-10-CM | POA: Insufficient documentation

## 2011-10-25 NOTE — Assessment & Plan Note (Signed)
REASON FOR VISIT:  Low back pain and left lower extremity pain.  SUBJECTIVE:  A 46 year old female with history of lumbar post laminectomy syndrome, chronic pain syndrome as well as depressive disorder.  She had some type of viral illness last week, missed her appointment and did not take her prescription.  She has had some sweats. She has had some abdominal cramping and discomfort out of meds for about 1 week now.  She continues to have pain in her left lower extremity.  Her pain level is described as 6/10, which is ironic given that her prior pain scores have been 7-8 while on the medications.  However, she states she really does not do anything now.  She is not walking around like she usually does.  She does not do her usual household tasks.  She has numbness and weakness of left lower extremity.  She has had some nausea, diarrhea and constipation.  OBJECTIVE:  VITAL SIGNS:  Blood pressure 157/101, rechecked at 158/82 with larger cuff, pulse 78, respiratory rate [place] 18 and O2 sat 98% on room air.  Weight 189 pounds.  Height 4 feet 11 inches. GENERAL:  No acute distress.  Mood and affect appropriate. EXTREMITIES:  Her gait is slow, but otherwise no evidence of toe drag or knee instability.  Negative straight leg raising test bilaterally.  She has decreased left S1 reflex.  She has normal strength in the hip flexors, knee extension, ankle dorsiflexor, great toe extensors bilaterally.  Hip, knee and ankle range of motion are normal. BACK:  Has some tenderness to palpation in the lumbosacral junction.  IMPRESSION:  Lumbar post laminectomy syndrome chronic neurogenic as well as mechanical low back pain.  PLAN:  We will reinstitute fentanyl patch 25 mcg q.72 h. as well as oxycodone 5 mg p.o. b.i.d. p.r.n. and gabapentin she will be taking 200 mg t.i.d.  She will have nursing visit in 1 month.  I can see her back in 3 months.     Erick Colace, M.D. Electronically  Signed    AEK/MedQ D:  10/25/2011 14:05:33  T:  10/25/2011 17:43:32  Job #:  [id number]

## 2011-11-23 ENCOUNTER — Encounter: Payer: PRIVATE HEALTH INSURANCE | Attending: Physical Medicine & Rehabilitation | Admitting: *Deleted

## 2011-11-23 ENCOUNTER — Encounter: Payer: Self-pay | Admitting: *Deleted

## 2011-11-23 VITALS — BP 149/81 | HR 80 | Resp 18 | Ht 60.0 in | Wt 189.0 lb

## 2011-11-23 DIAGNOSIS — R29898 Other symptoms and signs involving the musculoskeletal system: Secondary | ICD-10-CM | POA: Insufficient documentation

## 2011-11-23 DIAGNOSIS — G894 Chronic pain syndrome: Secondary | ICD-10-CM

## 2011-11-23 DIAGNOSIS — M545 Low back pain, unspecified: Secondary | ICD-10-CM | POA: Insufficient documentation

## 2011-11-23 DIAGNOSIS — M79609 Pain in unspecified limb: Secondary | ICD-10-CM | POA: Insufficient documentation

## 2011-11-23 DIAGNOSIS — R209 Unspecified disturbances of skin sensation: Secondary | ICD-10-CM | POA: Insufficient documentation

## 2011-11-23 DIAGNOSIS — M961 Postlaminectomy syndrome, not elsewhere classified: Secondary | ICD-10-CM | POA: Insufficient documentation

## 2011-11-23 MED ORDER — FENTANYL 25 MCG/HR TD PT72: 1.0000 | MEDICATED_PATCH | TRANSDERMAL | Status: DC

## 2011-11-23 MED ORDER — OXYCODONE HCL 5 MG PO CAPS: 5.0000 mg | ORAL_CAPSULE | Freq: Two times a day (BID) | ORAL | Status: DC | PRN

## 2011-11-23 NOTE — Progress Notes (Signed)
Started walking on ellyptical machine for exercise but noted increased foot pain. Concerned about BP readings, whether this is related to fentanyl patches. Discussed. Keeps regular appt's w/ PCP-will discuss BP w/ PCP. Reports getting no noticeable relief with breakthrough pain med.

## 2012-01-03 ENCOUNTER — Ambulatory Visit (HOSPITAL_BASED_OUTPATIENT_CLINIC_OR_DEPARTMENT_OTHER): Payer: PRIVATE HEALTH INSURANCE | Admitting: Physical Medicine & Rehabilitation

## 2012-01-03 ENCOUNTER — Encounter: Payer: PRIVATE HEALTH INSURANCE | Attending: Neurosurgery

## 2012-01-03 ENCOUNTER — Encounter: Payer: Self-pay | Admitting: Physical Medicine & Rehabilitation

## 2012-01-03 VITALS — BP 152/92 | HR 91 | Ht 60.0 in | Wt 182.0 lb

## 2012-01-03 DIAGNOSIS — R209 Unspecified disturbances of skin sensation: Secondary | ICD-10-CM | POA: Insufficient documentation

## 2012-01-03 DIAGNOSIS — M545 Low back pain, unspecified: Secondary | ICD-10-CM | POA: Insufficient documentation

## 2012-01-03 DIAGNOSIS — M5116 Intervertebral disc disorders with radiculopathy, lumbar region: Secondary | ICD-10-CM

## 2012-01-03 DIAGNOSIS — Z9884 Bariatric surgery status: Secondary | ICD-10-CM | POA: Insufficient documentation

## 2012-01-03 DIAGNOSIS — E669 Obesity, unspecified: Secondary | ICD-10-CM | POA: Insufficient documentation

## 2012-01-03 DIAGNOSIS — Z981 Arthrodesis status: Secondary | ICD-10-CM | POA: Insufficient documentation

## 2012-01-03 DIAGNOSIS — M5416 Radiculopathy, lumbar region: Secondary | ICD-10-CM | POA: Insufficient documentation

## 2012-01-03 DIAGNOSIS — M79609 Pain in unspecified limb: Secondary | ICD-10-CM | POA: Insufficient documentation

## 2012-01-03 DIAGNOSIS — IMO0002 Reserved for concepts with insufficient information to code with codable children: Secondary | ICD-10-CM | POA: Insufficient documentation

## 2012-01-03 DIAGNOSIS — G8929 Other chronic pain: Secondary | ICD-10-CM | POA: Insufficient documentation

## 2012-01-03 DIAGNOSIS — F329 Major depressive disorder, single episode, unspecified: Secondary | ICD-10-CM | POA: Insufficient documentation

## 2012-01-03 DIAGNOSIS — M5126 Other intervertebral disc displacement, lumbar region: Secondary | ICD-10-CM

## 2012-01-03 DIAGNOSIS — M961 Postlaminectomy syndrome, not elsewhere classified: Secondary | ICD-10-CM | POA: Insufficient documentation

## 2012-01-03 DIAGNOSIS — F3289 Other specified depressive episodes: Secondary | ICD-10-CM | POA: Insufficient documentation

## 2012-01-03 MED ORDER — METHOCARBAMOL 500 MG PO TABS: 500.0000 mg | ORAL_TABLET | Freq: Three times a day (TID) | ORAL | Status: AC

## 2012-01-03 MED ORDER — GABAPENTIN 100 MG PO CAPS: 300.0000 mg | ORAL_CAPSULE | Freq: Three times a day (TID) | ORAL | Status: DC

## 2012-01-03 NOTE — Patient Instructions (Signed)
May take 2 tablets of Tylenol 4 times per day as needed for breakthrough type pain Next visit we'll discuss whether we need to resume the fentanyl patch. Next visit will do a left S1 transforaminal epidural injection

## 2012-01-03 NOTE — Progress Notes (Signed)
  Subjective:    Patient ID: Mallory Silva, female    DOB: 04-08-1966, 46 y.o.   MRN: [id number]  HPI  The patient Mallory Silva out of her fentanyl and oxycodone last week. She missed an appointment. She states that her pain is a little worse off the medication. She admits to overusing the oxycodone. She has had no significant decline in her functional status.  Review of Systems     Objective:   Physical Exam  Please see other note from today      Assessment & Plan:  1. Lumbar postlaminectomy syndrome with chronic postoperative pain it is not clear that chronic opioids have been very successful in increasing her function. I discussed with her that we will try other alternative treatments. This will include a muscle relaxant methocarbamol 3 times a day as well as Tylenol. She is allergic to nonsteroidal anti-inflammatory so we cannot use this agent.  2. Chronic left S1 radiculitis. We'll set her up for left S1 transforaminal injection. Will increase gabapentin 300 mg 3 times per day.

## 2012-01-03 NOTE — Progress Notes (Signed)
  Subjective:    Patient ID: Mallory Silva, female    DOB: 1965-11-06, 46 y.o.   MRN: [id number]  HPI Pain Inventory Average Pain 6 Pain Right Now 5 My pain is sharp, burning, dull, stabbing and aching  In the last 24 hours, has pain interfered with the following? General activity 7 Relation with others 8 Enjoyment of life 8 What TIME of day is your pain at its worst? All Day Sleep (in general) Fair  Pain is worse with: walking, bending, sitting, inactivity, standing, unsure and some activites Pain improves with: rest and medication Relief from Meds: 6  Mobility walk without assistance how many minutes can you walk? 15-20 ability to climb steps?  yes do you drive?  yes  Function not employed: date last employed  I need assistance with the following:  meal prep, household duties and shopping  Neuro/Psych weakness numbness depression  Prior Studies Any changes since last visit?  no  Physicians involved in your care Any changes since last visit?  no  Review of Systems  Constitutional: Negative.   HENT: Negative.   Eyes: Negative.   Respiratory: Positive for cough.   Cardiovascular: Negative.   Gastrointestinal: Positive for nausea.  Genitourinary: Negative.   Musculoskeletal: Negative.   Skin: Negative.   Neurological: Negative.   Hematological: Negative.   Psychiatric/Behavioral: Negative.        Objective:   Physical Exam        Assessment & Plan:

## 2012-01-04 ENCOUNTER — Encounter: Payer: Self-pay | Admitting: Physical Medicine & Rehabilitation

## 2012-01-23 ENCOUNTER — Ambulatory Visit: Payer: PRIVATE HEALTH INSURANCE | Admitting: Physical Medicine & Rehabilitation

## 2012-01-26 ENCOUNTER — Encounter: Payer: Self-pay | Admitting: Physical Medicine & Rehabilitation

## 2012-01-26 ENCOUNTER — Ambulatory Visit (HOSPITAL_BASED_OUTPATIENT_CLINIC_OR_DEPARTMENT_OTHER): Payer: PRIVATE HEALTH INSURANCE | Admitting: Physical Medicine & Rehabilitation

## 2012-01-26 ENCOUNTER — Encounter: Payer: PRIVATE HEALTH INSURANCE | Attending: Neurosurgery

## 2012-01-26 DIAGNOSIS — F3289 Other specified depressive episodes: Secondary | ICD-10-CM | POA: Insufficient documentation

## 2012-01-26 DIAGNOSIS — F329 Major depressive disorder, single episode, unspecified: Secondary | ICD-10-CM | POA: Insufficient documentation

## 2012-01-26 DIAGNOSIS — M545 Low back pain, unspecified: Secondary | ICD-10-CM | POA: Insufficient documentation

## 2012-01-26 DIAGNOSIS — E669 Obesity, unspecified: Secondary | ICD-10-CM | POA: Insufficient documentation

## 2012-01-26 DIAGNOSIS — G8929 Other chronic pain: Secondary | ICD-10-CM | POA: Insufficient documentation

## 2012-01-26 DIAGNOSIS — M961 Postlaminectomy syndrome, not elsewhere classified: Secondary | ICD-10-CM

## 2012-01-26 DIAGNOSIS — M79609 Pain in unspecified limb: Secondary | ICD-10-CM | POA: Insufficient documentation

## 2012-01-26 DIAGNOSIS — Z9884 Bariatric surgery status: Secondary | ICD-10-CM | POA: Insufficient documentation

## 2012-01-26 DIAGNOSIS — Z981 Arthrodesis status: Secondary | ICD-10-CM | POA: Insufficient documentation

## 2012-01-26 DIAGNOSIS — IMO0002 Reserved for concepts with insufficient information to code with codable children: Secondary | ICD-10-CM | POA: Insufficient documentation

## 2012-01-26 DIAGNOSIS — IMO0001 Reserved for inherently not codable concepts without codable children: Secondary | ICD-10-CM

## 2012-01-26 DIAGNOSIS — R209 Unspecified disturbances of skin sensation: Secondary | ICD-10-CM | POA: Insufficient documentation

## 2012-01-26 NOTE — Progress Notes (Signed)
Subjective:    Patient ID: Mallory Silva, female    DOB: Aug 13, 1966, 46 y.o.   MRN: [id number]  HPI New right buttocks pain started yesterday. No falls or injury. Her back feels about the same as a combination between muscle relaxer and gabapentin. Pain Inventory Average Pain 6 Pain Right Now 7 My pain is sharp, burning, dull, stabbing and aching  In the last 24 hours, has pain interfered with the following? General activity 8 Relation with others 8 Enjoyment of life 8 What TIME of day is your pain at its worst? all day Sleep (in general) Fair  Pain is worse with: walking, bending, sitting, standing and some activites Pain improves with: rest and medication Relief from Meds: 6  Mobility walk without assistance how many minutes can you walk? 10-15 min ability to climb steps?  yes do you drive?  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  no  Physicians involved in your care Any changes since last visit?  no       Review of Systems  Constitutional: Negative.   HENT: Negative.   Eyes: Negative.   Respiratory: Negative.   Cardiovascular: Negative.   Gastrointestinal: Positive for diarrhea and constipation.  Genitourinary: Negative.   Musculoskeletal: Negative.   Skin: Negative.   Neurological: Positive for numbness.  Hematological: Negative.   Psychiatric/Behavioral: Positive for dysphoric mood.       Objective:   Physical Exam  Constitutional: She is oriented to person, place, and time. She appears well-developed and well-nourished.  Musculoskeletal:       Right hip: She exhibits decreased range of motion and tenderness.       Left hip: She exhibits decreased range of motion and tenderness.       Lumbar back: She exhibits decreased range of motion. She exhibits no tenderness and no deformity.       Legs:      Alertness over the right gluteus maximus  muscle. Tenderness palpation over bilateral greater trochanters.  Neurological: She is alert and oriented to person, place, and time. She has normal strength. Coordination normal.  Reflex Scores:      Tricep reflexes are 2+ on the right side and 2+ on the left side.      Bicep reflexes are 2+ on the right side and 2+ on the left side.      Brachioradialis reflexes are 2+ on the right side and 2+ on the left side.      Patellar reflexes are 2+ on the right side and 2+ on the left side.      Achilles reflexes are 2+ on the right side and 2+ on the left side. Psychiatric: She has a normal mood and affect.          Assessment & Plan:  1. Lumbar post laminectomy syndrome her pain scores are equivalent on and off opiates in fact a little better off opiates 2. Bilateral greater trochanter bursitis this is mild 3. Right gluteus maximus trigger point will inject today right gluteus  Trigger Point Injection  Indication:  right gluteus maximus Myofascial pain not relieved by medication management and other conservative care.  Informed consent was obtained after describing risk and benefits of the procedure with the patient, this includes bleeding, bruising, infection and medication side effects.  The patient wishes to proceed and has given written consent.  The patient was placed in a prone  position.  The right gluteus area was marked and prepped with Betadine.  It was entered with a 25-gauge 1-1/2 inch needle and 3 mL of 1% lidocaine 1ml ok depo medrol was injected into each of 2 trigger points, after negative draw back for blood.  The patient tolerated the procedure well.  Post procedure instructions were given.

## 2012-01-31 ENCOUNTER — Other Ambulatory Visit: Payer: Self-pay | Admitting: *Deleted

## 2012-01-31 ENCOUNTER — Ambulatory Visit: Payer: PRIVATE HEALTH INSURANCE | Admitting: Physical Medicine & Rehabilitation

## 2012-02-08 ENCOUNTER — Other Ambulatory Visit: Payer: Self-pay

## 2012-02-08 ENCOUNTER — Telehealth: Payer: Self-pay | Admitting: *Deleted

## 2012-02-08 ENCOUNTER — Other Ambulatory Visit: Payer: Self-pay | Admitting: *Deleted

## 2012-02-08 MED ORDER — GABAPENTIN 100 MG PO CAPS: 300.0000 mg | ORAL_CAPSULE | Freq: Three times a day (TID) | ORAL | Status: DC

## 2012-02-08 MED ORDER — GABAPENTIN 300 MG PO CAPS: 300.0000 mg | ORAL_CAPSULE | Freq: Three times a day (TID) | ORAL | Status: DC

## 2012-02-08 NOTE — Telephone Encounter (Signed)
I had injection on 01/26/12 with Dr Wynn Banker. I have fallen 2 times on the same day on the Saturday after the shot. I have no pain medication except for the gabapentin and robaxin.  Pain is really bad, up higher in her back and in the middle. Can you please order me some pain medication if possible?

## 2012-02-09 ENCOUNTER — Telehealth: Payer: Self-pay | Admitting: *Deleted

## 2012-02-09 MED ORDER — ACETAMINOPHEN-CODEINE #3 300-30 MG PO TABS: 1.0000 | ORAL_TABLET | Freq: Three times a day (TID) | ORAL | Status: AC

## 2012-02-09 NOTE — Telephone Encounter (Signed)
Addended by: Caryl Ada on: 02/09/2012 03:56 PM   Modules accepted: Orders

## 2012-02-09 NOTE — Telephone Encounter (Signed)
Rx has been called in, pt aware. 

## 2012-02-09 NOTE — Telephone Encounter (Signed)
Pt was given a cortisone injection at her last appointment. Since then she has fallen and is in a lot of pain. Dr. Wynn Banker has taken her off pain medication but she feels like she needs something now.

## 2012-02-09 NOTE — Telephone Encounter (Signed)
Pt was given a cortisone injection that last time she was here. Since then she has fallen and is in a lot of pain. Dr. Wynn Banker has taken her off pain medication but she feels that she will need something now. Please advise.

## 2012-02-09 NOTE — Telephone Encounter (Signed)
This is a duplicate encounter.

## 2012-02-09 NOTE — Telephone Encounter (Signed)
May call in Tylenol #3 one by mouth 3 times a day #21 take for 7 days then stop

## 2012-02-28 ENCOUNTER — Encounter: Payer: PRIVATE HEALTH INSURANCE | Admitting: Physical Medicine and Rehabilitation

## 2012-03-22 ENCOUNTER — Encounter: Payer: PRIVATE HEALTH INSURANCE | Admitting: Physical Medicine and Rehabilitation

## 2012-06-08 ENCOUNTER — Encounter (HOSPITAL_COMMUNITY): Payer: Self-pay

## 2012-06-08 ENCOUNTER — Emergency Department (HOSPITAL_COMMUNITY)
Admission: EM | Admit: 2012-06-08 | Discharge: 2012-06-08 | Disposition: A | Discharge status: Home / Self Care | Payer: PRIVATE HEALTH INSURANCE | Attending: Emergency Medicine | Admitting: Emergency Medicine

## 2012-06-08 ENCOUNTER — Emergency Department (HOSPITAL_COMMUNITY): Payer: PRIVATE HEALTH INSURANCE

## 2012-06-08 DIAGNOSIS — G894 Chronic pain syndrome: Secondary | ICD-10-CM | POA: Insufficient documentation

## 2012-06-08 DIAGNOSIS — Z79899 Other long term (current) drug therapy: Secondary | ICD-10-CM | POA: Insufficient documentation

## 2012-06-08 DIAGNOSIS — R197 Diarrhea, unspecified: Secondary | ICD-10-CM | POA: Insufficient documentation

## 2012-06-08 DIAGNOSIS — R109 Unspecified abdominal pain: Secondary | ICD-10-CM

## 2012-06-08 DIAGNOSIS — F341 Dysthymic disorder: Secondary | ICD-10-CM | POA: Insufficient documentation

## 2012-06-08 LAB — URINALYSIS, ROUTINE W REFLEX MICROSCOPIC
Glucose, UA: NEGATIVE mg/dL
Hgb urine dipstick: NEGATIVE
Specific Gravity, Urine: 1.029 (ref 1.005–1.030)
Urobilinogen, UA: 1 mg/dL (ref 0.0–1.0)
pH: 7 (ref 5.0–8.0)

## 2012-06-08 LAB — COMPREHENSIVE METABOLIC PANEL
AST: 24 U/L (ref 0–37)
Albumin: 4.6 g/dL (ref 3.5–5.2)
Calcium: 10.5 mg/dL (ref 8.4–10.5)
Creatinine, Ser: 0.89 mg/dL (ref 0.50–1.10)
GFR calc non Af Amer: 75 mL/min — ABNORMAL LOW (ref >90)
Sodium: 142 mEq/L (ref 135–145)
Total Protein: 8.4 g/dL — ABNORMAL HIGH (ref 6.0–8.3)

## 2012-06-08 LAB — CBC WITH DIFFERENTIAL/PLATELET
Basophils Absolute: 0.1 10*3/uL (ref 0.0–0.1)
Basophils Relative: 1 % (ref 0–1)
Eosinophils Absolute: 0.1 10*3/uL (ref 0.0–0.7)
Eosinophils Relative: 1 % (ref 0–5)
HCT: 39.9 % (ref 36.0–46.0)
MCHC: 35.6 g/dL (ref 30.0–36.0)
MCV: 83.1 fL (ref 78.0–100.0)
Monocytes Absolute: 0.5 10*3/uL (ref 0.1–1.0)
Platelets: 300 10*3/uL (ref 150–400)
RDW: 14.2 % (ref 11.5–15.5)

## 2012-06-08 MED ORDER — IOHEXOL 300 MG/ML  SOLN
80.0000 mL | Freq: Once | INTRAMUSCULAR | Status: AC | PRN
  Administered 2012-06-08: 80 mL via INTRAVENOUS

## 2012-06-08 MED ORDER — SODIUM CHLORIDE 0.9 % IV SOLN
INTRAVENOUS | Status: DC
  Administered 2012-06-08: 17:00:00 via INTRAVENOUS

## 2012-06-08 MED ORDER — HYDROMORPHONE HCL PF 1 MG/ML IJ SOLN
1.0000 mg | Freq: Once | INTRAMUSCULAR | Status: AC
  Administered 2012-06-08: 1 mg via INTRAVENOUS
  Filled 2012-06-08: qty 1

## 2012-06-08 MED ORDER — LORAZEPAM 2 MG/ML IJ SOLN: 1.0000 mg | Freq: Once | INTRAMUSCULAR | Status: DC

## 2012-06-08 MED ORDER — IOHEXOL 300 MG/ML  SOLN
20.0000 mL | INTRAMUSCULAR | Status: AC
  Administered 2012-06-08 (×2): 20 mL via ORAL

## 2012-06-08 MED ORDER — ONDANSETRON HCL 4 MG/2ML IJ SOLN
4.0000 mg | Freq: Once | INTRAMUSCULAR | Status: AC
  Administered 2012-06-08: 4 mg via INTRAVENOUS
  Filled 2012-06-08: qty 2

## 2012-06-08 MED ORDER — SODIUM CHLORIDE 0.9 % IV BOLUS (SEPSIS)
1000.0000 mL | Freq: Once | INTRAVENOUS | Status: AC
  Administered 2012-06-08: 1000 mL via INTRAVENOUS

## 2012-06-08 NOTE — ED Notes (Signed)
Abdominal pain and nausea since Tuesday. Pt states she has only had 8oz of PO fluid since yesterday. Unable to vomit due to gastric bypass surgery.

## 2012-06-08 NOTE — ED Notes (Signed)
CT notified pt finished with contrast. 

## 2012-06-08 NOTE — ED Notes (Signed)
Pt reporting feeling depressed. Denying any SI or HI. Denying any plan. Pt responding appropriately. Affect appropriate. Pt reporting taking antidepressants due to losing both mother and father recently. Not seeing psychiatrist at this time.  PA made aware

## 2012-06-08 NOTE — ED Notes (Signed)
Pt moved to CDU bed 3.

## 2012-06-08 NOTE — ED Notes (Signed)
sts decreased po intake, feels dehydrted, diarrhea.

## 2012-06-08 NOTE — ED Provider Notes (Signed)
History     CSN: [id number]  Arrival date & time 06/08/12  1300   First MD Initiated Contact with Patient 06/08/12 1505      Chief Complaint  Patient presents with  . Dehydration    (Consider location/radiation/quality/duration/timing/severity/associated sxs/prior treatment) The history is provided by the patient.   patient here complaining of abdominal pain and diarrhea for the past 4 days. No fever pain is worse at the right lower quadrant. Nausea but no vomiting. Patient states that she is unable to vomit do to prior gastric bypass surgery. Diarrhea is watery without blood. Has used over-the-counter medications without relief. No prior history of same. Nothing makes her symptoms better worse and  Past Medical History  Diagnosis Date  . Lumbar post-laminectomy syndrome   . Chronic pain syndrome   . Depression   . Anxiety     Past Surgical History  Procedure Date  . Tonsilectomy, adenoidectomy, bilateral myringotomy and tubes   . Cesarean section   . Miscarriage   . Polypectomy   . Gastric bypass   . Hernia repair   . Tummy tuck   . Abdominal hysterectomy   . Spine surgery   . Cholecystectomy     Family History  Problem Relation Age of Onset  . Cancer Mother   . Alzheimer's disease Mother   . Chronic fatigue Mother   . Cancer Father   . Cancer Sister   . Chronic fatigue Sister   . Thyroid disease Sister   . Depression Sister   . Diabetes Sister   . Hyperlipidemia Sister   . Hypertension Sister     History  Substance Use Topics  . Smoking status: Never Smoker   . Smokeless tobacco: Not on file  . Alcohol Use: No    OB History    Grav Para Term Preterm Abortions TAB SAB Ect Mult Living                  Review of Systems  All other systems reviewed and are negative.    Allergies  Nsaids; Penicillins; Sulfa antibiotics; and Tape  Home Medications   Current Outpatient Rx  Name Route Sig Dispense Refill  . ACETAMINOPHEN 325 MG PO TABS Oral  Take 325 mg by mouth every 6 (six) hours as needed. For pain.    Marland Kitchen BUPROPION HCL ER (XL) 150 MG PO TB24 Oral Take 150 mg by mouth daily.    Marland Kitchen CLONAZEPAM 2 MG PO TABS Oral Take 2 mg by mouth at bedtime as needed. For insomnia and restless leg syndrome.    Marland Kitchen DIPHENHYDRAMINE HCL 25 MG PO TABS Oral Take 25 mg by mouth every 6 (six) hours as needed. For allergies.    . DULOXETINE HCL 60 MG PO CPEP Oral Take 60 mg by mouth at bedtime.    . FENTANYL 25 MCG/HR TD PT72 Transdermal Place 1 patch onto the skin every 3 (three) days.    Marland Kitchen GABAPENTIN 300 MG PO CAPS Oral Take 300 mg by mouth 3 (three) times daily.    . OXYCODONE-ACETAMINOPHEN 10-325 MG PO TABS Oral Take 1 tablet by mouth every 4 (four) hours as needed.      BP 151/101  Pulse 117  Temp 99.2 F (37.3 C) (Oral)  Resp 20  SpO2 100%  Physical Exam  Nursing note and vitals reviewed. Constitutional: She is oriented to person, place, and time. She appears well-developed and well-nourished.  Non-toxic appearance. No distress.  HENT:  Head: Normocephalic and atraumatic.  Eyes: Conjunctivae normal, EOM and lids are normal. Pupils are equal, round, and reactive to light.  Neck: Normal range of motion. Neck supple. No tracheal deviation present. No mass present.  Cardiovascular: Regular rhythm and normal heart sounds.  Tachycardia present.  Exam reveals no gallop.   No murmur heard. Pulmonary/Chest: Effort normal and breath sounds normal. No stridor. No respiratory distress. She has no decreased breath sounds. She has no wheezes. She has no rhonchi. She has no rales.  Abdominal: Soft. Normal appearance and bowel sounds are normal. She exhibits no distension. There is tenderness in the right lower quadrant. There is no rigidity, no rebound, no guarding and no CVA tenderness.  Musculoskeletal: Normal range of motion. She exhibits no edema and no tenderness.  Neurological: She is alert and oriented to person, place, and time. She has normal strength.  No cranial nerve deficit or sensory deficit. GCS eye subscore is 4. GCS verbal subscore is 5. GCS motor subscore is 6.  Skin: Skin is warm and dry. No abrasion and no rash noted.  Psychiatric: She has a normal mood and affect. Her speech is normal and behavior is normal.    ED Course  Procedures (including critical care time)  Labs Reviewed  COMPREHENSIVE METABOLIC PANEL - Abnormal; Notable for the following:    Glucose, Bld 101 (*)     Total Protein 8.4 (*)     GFR calc non Af Amer 75 (*)     GFR calc Af Amer 87 (*)     All other components within normal limits  CBC WITH DIFFERENTIAL  URINALYSIS, ROUTINE W REFLEX MICROSCOPIC  CBC WITH DIFFERENTIAL  COMPREHENSIVE METABOLIC PANEL  LIPASE, BLOOD   No results found.   No diagnosis found.    MDM  Patient abdominal CT to rule out appendicitis. She'll be sent to the CDU        Toy Baker, MD 06/08/12 1544

## 2012-06-08 NOTE — ED Provider Notes (Signed)
5:12 PM Care assumed in the CDU. Abdominal CT pending. If negative, patient can be discharged.   7:21 PM Patient's CT abdomen is negative for any acute intraabdominal processes. She can be discharge with PCP follow up and also referral to psychiatry per patient request. No further evaluation needed at this time. Patient is stable.   Filed Vitals:   06/08/12 1630  BP: 144/83  Pulse: 98  Temp:   Resp: [id number] PA-C 06/08/12 1923

## 2012-06-09 NOTE — ED Provider Notes (Signed)
Medical screening examination/treatment/procedure(s) were performed by non-physician practitioner and as supervising physician I was immediately available for consultation/collaboration.  Harel Repetto T Kaesyn Johnston, MD 06/09/12 1654 

## 2012-10-17 ENCOUNTER — Other Ambulatory Visit: Payer: Self-pay | Admitting: Gastroenterology

## 2012-10-17 DIAGNOSIS — R1013 Epigastric pain: Secondary | ICD-10-CM

## 2012-10-18 ENCOUNTER — Other Ambulatory Visit: Payer: Self-pay | Admitting: Gastroenterology

## 2012-10-18 ENCOUNTER — Ambulatory Visit
Admission: RE | Admit: 2012-10-18 | Discharge: 2012-10-18 | Disposition: A | Discharge status: Home / Self Care | Payer: BC Managed Care – PPO | Source: Ambulatory Visit | Attending: Gastroenterology | Admitting: Gastroenterology

## 2012-10-18 DIAGNOSIS — R1013 Epigastric pain: Secondary | ICD-10-CM

## 2012-11-05 ENCOUNTER — Other Ambulatory Visit: Payer: Self-pay | Admitting: Gastroenterology

## 2012-11-05 ENCOUNTER — Ambulatory Visit
Admission: RE | Admit: 2012-11-05 | Discharge: 2012-11-05 | Disposition: A | Discharge status: Home / Self Care | Payer: BC Managed Care – PPO | Source: Ambulatory Visit | Attending: Gastroenterology | Admitting: Gastroenterology

## 2012-11-05 DIAGNOSIS — Z09 Encounter for follow-up examination after completed treatment for conditions other than malignant neoplasm: Secondary | ICD-10-CM

## 2015-06-07 ENCOUNTER — Ambulatory Visit (INDEPENDENT_AMBULATORY_CARE_PROVIDER_SITE_OTHER): Payer: BLUE CROSS/BLUE SHIELD | Admitting: Family Medicine

## 2015-06-07 ENCOUNTER — Emergency Department (HOSPITAL_COMMUNITY): Payer: BLUE CROSS/BLUE SHIELD

## 2015-06-07 ENCOUNTER — Emergency Department (HOSPITAL_COMMUNITY)
Admission: EM | Admit: 2015-06-07 | Discharge: 2015-06-08 | Disposition: A | Discharge status: Home / Self Care | Payer: BLUE CROSS/BLUE SHIELD | Attending: Emergency Medicine | Admitting: Emergency Medicine

## 2015-06-07 ENCOUNTER — Other Ambulatory Visit: Payer: Self-pay

## 2015-06-07 ENCOUNTER — Encounter (HOSPITAL_COMMUNITY): Payer: Self-pay

## 2015-06-07 VITALS — BP 152/80 | HR 96 | Temp 97.9°F | Resp 16 | Ht 59.5 in | Wt 176.2 lb

## 2015-06-07 DIAGNOSIS — R300 Dysuria: Secondary | ICD-10-CM | POA: Diagnosis not present

## 2015-06-07 DIAGNOSIS — R42 Dizziness and giddiness: Secondary | ICD-10-CM

## 2015-06-07 DIAGNOSIS — G894 Chronic pain syndrome: Secondary | ICD-10-CM | POA: Diagnosis not present

## 2015-06-07 DIAGNOSIS — N390 Urinary tract infection, site not specified: Secondary | ICD-10-CM

## 2015-06-07 DIAGNOSIS — E871 Hypo-osmolality and hyponatremia: Secondary | ICD-10-CM | POA: Diagnosis not present

## 2015-06-07 DIAGNOSIS — R109 Unspecified abdominal pain: Secondary | ICD-10-CM | POA: Diagnosis present

## 2015-06-07 DIAGNOSIS — Z880 Allergy status to penicillin: Secondary | ICD-10-CM | POA: Insufficient documentation

## 2015-06-07 DIAGNOSIS — R202 Paresthesia of skin: Secondary | ICD-10-CM | POA: Diagnosis not present

## 2015-06-07 DIAGNOSIS — R200 Anesthesia of skin: Secondary | ICD-10-CM | POA: Insufficient documentation

## 2015-06-07 DIAGNOSIS — D649 Anemia, unspecified: Secondary | ICD-10-CM

## 2015-06-07 DIAGNOSIS — Z79899 Other long term (current) drug therapy: Secondary | ICD-10-CM | POA: Diagnosis not present

## 2015-06-07 DIAGNOSIS — Z8739 Personal history of other diseases of the musculoskeletal system and connective tissue: Secondary | ICD-10-CM | POA: Diagnosis not present

## 2015-06-07 LAB — COMPREHENSIVE METABOLIC PANEL
ALBUMIN: 3.5 g/dL (ref 3.5–5.0)
ALK PHOS: 47 U/L (ref 38–126)
ALT: 23 U/L (ref 14–54)
AST: 35 U/L (ref 15–41)
Anion gap: 9 (ref 5–15)
BILIRUBIN TOTAL: 0.8 mg/dL (ref 0.3–1.2)
CALCIUM: 8.4 mg/dL — AB (ref 8.9–10.3)
CO2: 24 mmol/L (ref 22–32)
Chloride: 92 mmol/L — ABNORMAL LOW (ref 101–111)
Creatinine, Ser: 0.69 mg/dL (ref 0.44–1.00)
GFR calc Af Amer: >60 mL/min (ref >60)
GFR calc non Af Amer: >60 mL/min (ref >60)
GLUCOSE: 85 mg/dL (ref 65–99)
POTASSIUM: 3.5 mmol/L (ref 3.5–5.1)
Sodium: 125 mmol/L — ABNORMAL LOW (ref 135–145)
TOTAL PROTEIN: 7 g/dL (ref 6.5–8.1)

## 2015-06-07 LAB — POCT CBC
Granulocyte percent: 68.1 %G (ref 37–80)
HEMATOCRIT: 30.9 % — AB (ref 37.7–47.9)
HEMOGLOBIN: 9.9 g/dL — AB (ref 12.2–16.2)
LYMPH, POC: 2.7 (ref 0.6–3.4)
MCH, POC: 25.7 pg — AB (ref 27–31.2)
MCHC: 32.1 g/dL (ref 31.8–35.4)
MCV: 80.2 fL (ref 80–97)
MID (cbc): 0.9 (ref 0–0.9)
MPV: 7 fL (ref 0–99.8)
POC GRANULOCYTE: 7.6 — AB (ref 2–6.9)
POC LYMPH %: 24 % (ref 10–50)
POC MID %: 7.9 %M (ref 0–12)
Platelet Count, POC: 331 10*3/uL (ref 142–424)
RBC: 3.85 M/uL — AB (ref 4.04–5.48)
RDW, POC: 15.7 %
WBC: 11.1 10*3/uL — AB (ref 4.6–10.2)

## 2015-06-07 LAB — I-STAT TROPONIN, ED: Troponin i, poc: 0 ng/mL (ref 0.00–0.08)

## 2015-06-07 LAB — I-STAT CHEM 8, ED
BUN: 6 mg/dL (ref 6–20)
CALCIUM ION: 1.08 mmol/L — AB (ref 1.12–1.23)
CHLORIDE: 90 mmol/L — AB (ref 101–111)
Creatinine, Ser: 0.7 mg/dL (ref 0.44–1.00)
Glucose, Bld: 86 mg/dL (ref 65–99)
HCT: 33 % — ABNORMAL LOW (ref 36.0–46.0)
Hemoglobin: 11.2 g/dL — ABNORMAL LOW (ref 12.0–15.0)
POTASSIUM: 3.6 mmol/L (ref 3.5–5.1)
SODIUM: 126 mmol/L — AB (ref 135–145)
TCO2: 24 mmol/L (ref 0–100)

## 2015-06-07 LAB — POCT URINALYSIS DIPSTICK
BILIRUBIN UA: NEGATIVE
GLUCOSE UA: NEGATIVE
KETONES UA: NEGATIVE
Nitrite, UA: NEGATIVE
PH UA: 6
Protein, UA: NEGATIVE
Spec Grav, UA: <=1.005
Urobilinogen, UA: 0.2

## 2015-06-07 LAB — GLUCOSE, POCT (MANUAL RESULT ENTRY): POC GLUCOSE: 96 mg/dL (ref 70–99)

## 2015-06-07 LAB — POCT UA - MICROSCOPIC ONLY
CASTS, UR, LPF, POC: NEGATIVE
CRYSTALS, UR, HPF, POC: NEGATIVE
Mucus, UA: NEGATIVE
Yeast, UA: NEGATIVE

## 2015-06-07 LAB — DIFFERENTIAL
BASOS ABS: 0.1 10*3/uL (ref 0.0–0.1)
Basophils Relative: 1 % (ref 0–1)
Eosinophils Absolute: 0.1 10*3/uL (ref 0.0–0.7)
Eosinophils Relative: 1 % (ref 0–5)
LYMPHS ABS: 2.6 10*3/uL (ref 0.7–4.0)
LYMPHS PCT: 24 % (ref 12–46)
Monocytes Absolute: 0.9 10*3/uL (ref 0.1–1.0)
Monocytes Relative: 9 % (ref 3–12)
NEUTROS PCT: 65 % (ref 43–77)
Neutro Abs: 7.1 10*3/uL (ref 1.7–7.7)

## 2015-06-07 LAB — CBC
HCT: 30.3 % — ABNORMAL LOW (ref 36.0–46.0)
HEMOGLOBIN: 10.3 g/dL — AB (ref 12.0–15.0)
MCH: 27.2 pg (ref 26.0–34.0)
MCHC: 34 g/dL (ref 30.0–36.0)
MCV: 80.2 fL (ref 78.0–100.0)
Platelets: 293 10*3/uL (ref 150–400)
RBC: 3.78 MIL/uL — AB (ref 3.87–5.11)
RDW: 14.7 % (ref 11.5–15.5)
WBC: 10.7 10*3/uL — ABNORMAL HIGH (ref 4.0–10.5)

## 2015-06-07 LAB — PROTIME-INR
INR: 1.11 (ref 0.00–1.49)
Prothrombin Time: 14.5 seconds (ref 11.6–15.2)

## 2015-06-07 LAB — CBG MONITORING, ED: Glucose-Capillary: 94 mg/dL (ref 65–99)

## 2015-06-07 LAB — APTT: APTT: 28 s (ref 24–37)

## 2015-06-07 MED ORDER — BENZONATATE 100 MG PO CAPS
100.0000 mg | ORAL_CAPSULE | Freq: Once | ORAL | Status: AC
  Administered 2015-06-08: 100 mg via ORAL
  Filled 2015-06-07: qty 1

## 2015-06-07 MED ORDER — DEXTROSE 5 % IV SOLN
1.0000 g | Freq: Once | INTRAVENOUS | Status: AC
  Administered 2015-06-08: 1 g via INTRAVENOUS
  Filled 2015-06-07: qty 10

## 2015-06-07 MED ORDER — CEPHALEXIN 500 MG PO CAPS: 500.0000 mg | ORAL_CAPSULE | Freq: Two times a day (BID) | ORAL | Status: DC

## 2015-06-07 MED ORDER — CIPROFLOXACIN HCL 500 MG PO TABS: 500.0000 mg | ORAL_TABLET | Freq: Two times a day (BID) | ORAL | Status: DC

## 2015-06-07 MED ORDER — ONDANSETRON HCL 4 MG PO TABS: 4.0000 mg | ORAL_TABLET | Freq: Four times a day (QID) | ORAL | Status: DC

## 2015-06-07 MED ORDER — SODIUM CHLORIDE 0.9 % IV BOLUS (SEPSIS)
1000.0000 mL | Freq: Once | INTRAVENOUS | Status: AC
  Administered 2015-06-07: 1000 mL via INTRAVENOUS

## 2015-06-07 NOTE — ED Provider Notes (Signed)
CSN: [id number]     Arrival date & time 06/07/15  1658 History   First MD Initiated Contact with Patient 06/07/15 2018     Chief Complaint  Patient presents with  . Dizziness  . Abdominal Pain      Patient is a 49 y.o. female presenting with dizziness and abdominal pain. The history is provided by the patient. No language interpreter was used.  Dizziness Abdominal Pain  Mallory Silva presents for evaluation of dizziness and dysuria. This morning she awoke and felt very dizzy, unsteady. She felt some vertigo-type symptoms. Her dizziness has been waxing and waning throughout the day. This morning she developed some dysuria and burning with urination. She began drinking extra water to see if that improved her symptoms. With the additional water with dysuria improved but she still did have pain at the end of voiding. She was seen at urgent care today and they started her on Cipro but recommended referral to the emergency department given the symptoms of her dizziness and the numbness on her right arm. She denies any medical problems. No fevers, vomiting, diarrhea, weakness.  Past Medical History  Diagnosis Date  . Lumbar post-laminectomy syndrome   . Chronic pain syndrome   . Depression   . Anxiety    Past Surgical History  Procedure Laterality Date  . Tonsilectomy, adenoidectomy, bilateral myringotomy and tubes    . Cesarean section    . Miscarriage    . Polypectomy    . Gastric bypass    . Hernia repair    . Tummy tuck    . Abdominal hysterectomy    . Spine surgery    . Cholecystectomy     Family History  Problem Relation Age of Onset  . Cancer Mother   . Alzheimer's disease Mother   . Chronic fatigue Mother   . Cancer Father   . Cancer Sister   . Chronic fatigue Sister   . Thyroid disease Sister   . Depression Sister   . Diabetes Sister   . Hyperlipidemia Sister   . Hypertension Sister    Social History  Substance Use Topics  . Smoking status: Never Smoker   .  Smokeless tobacco: None  . Alcohol Use: No   OB History    No data available     Review of Systems  Gastrointestinal: Positive for abdominal pain.  Neurological: Positive for dizziness.  All other systems reviewed and are negative.     Allergies  Nsaids; Other; Penicillins; Sulfa antibiotics; and Tape  Home Medications   Prior to Admission medications   Medication Sig Start Date End Date Taking? Authorizing Provider  acetaminophen (TYLENOL) 325 MG tablet Take 325 mg by mouth every 6 (six) hours as needed. For pain.   Yes Historical Provider, MD  cetirizine (ZYRTEC) 10 MG chewable tablet Chew 10 mg by mouth at bedtime.    Yes Historical Provider, MD  clonazePAM (KLONOPIN) 1 MG tablet Take 1-2 mg by mouth at bedtime. For restless legs   Yes Historical Provider, MD  dextromethorphan (DELSYM) 30 MG/5ML liquid Take 60 mg by mouth as needed for cough.   Yes Historical Provider, MD  Estradiol (ESTRACE VA) Place 1 capsule vaginally daily as needed (for dryness). Estrace insert capsule   Yes Historical Provider, MD  estradiol (ESTRACE) 1 MG tablet Take 1 mg by mouth daily.   Yes Historical Provider, MD  fexofenadine (ALLEGRA) 180 MG tablet Take 180 mg by mouth daily.   Yes Historical Provider, MD  fexofenadine-pseudoephedrine (ALLEGRA-D 24) 180-240 MG per 24 hr tablet Take 1 tablet by mouth daily as needed (for congestion).   Yes Historical Provider, MD  guaiFENesin (ROBITUSSIN) 100 MG/5ML liquid Take 200 mg by mouth 3 (three) times daily as needed for cough.   Yes Historical Provider, MD  Melatonin 3 MG TBDP Take 3 mg by mouth at bedtime as needed (for sleep).    Yes Historical Provider, MD  omeprazole (PRILOSEC) 20 MG capsule Take 20 mg by mouth daily.   Yes Historical Provider, MD  promethazine-codeine (PHENERGAN WITH CODEINE) 6.25-10 MG/5ML syrup Take 5 mLs by mouth daily as needed. 06/03/15  Yes Historical Provider, MD  ciprofloxacin (CIPRO) 500 MG tablet Take 1 tablet (500 mg total) by  mouth 2 (two) times daily. 06/07/15   Gwenlyn Found Copland, MD   BP 159/91 mmHg  Pulse 66  Temp(Src) 98.2 F (36.8 C) (Oral)  Resp 14  Ht 4\' 11"  (1.499 m)  Wt 173 lb 14.4 oz (78.881 kg)  BMI 35.11 kg/m2  SpO2 99% Physical Exam  Constitutional: She is oriented to person, place, and time. She appears well-developed and well-nourished.  HENT:  Head: Normocephalic and atraumatic.  Mouth/Throat: Oropharynx is clear and moist.  Eyes: EOM are normal. Pupils are equal, round, and reactive to light.  Cardiovascular: Normal rate and regular rhythm.   No murmur heard. Pulmonary/Chest: Effort normal and breath sounds normal. No respiratory distress.  Abdominal: Soft. There is no tenderness. There is no rebound and no guarding.  Musculoskeletal: She exhibits no edema or tenderness.  Neurological: She is alert and oriented to person, place, and time. No cranial nerve deficit. Coordination normal.  5 out of 5 strength in all 4 extremities. Sensation to light touch intact in all 4 extremities.  Skin: Skin is warm and dry.  Psychiatric: She has a normal mood and affect. Her behavior is normal.  Nursing note and vitals reviewed.   ED Course  Procedures (including critical care time) Labs Review Labs Reviewed  CBC - Abnormal; Notable for the following:    WBC 10.7 (*)    RBC 3.78 (*)    Hemoglobin 10.3 (*)    HCT 30.3 (*)    All other components within normal limits  COMPREHENSIVE METABOLIC PANEL - Abnormal; Notable for the following:    Sodium 125 (*)    Chloride 92 (*)    BUN <5 (*)    Calcium 8.4 (*)    All other components within normal limits  I-STAT CHEM 8, ED - Abnormal; Notable for the following:    Sodium 126 (*)    Chloride 90 (*)    Calcium, Ion 1.08 (*)    Hemoglobin 11.2 (*)    HCT 33.0 (*)    All other components within normal limits  PROTIME-INR  APTT  DIFFERENTIAL  I-STAT TROPOININ, ED  CBG MONITORING, ED    Imaging Review Dg Chest 2 View  06/07/2015   CLINICAL  DATA:  48 year old female with right arm and hand numbness  EXAM: CHEST  2 VIEW  COMPARISON:  None.  FINDINGS: The heart size and mediastinal contours are within normal limits. Both lungs are clear. The visualized skeletal structures are unremarkable.  IMPRESSION: No active cardiopulmonary disease.   Electronically Signed   By: Elgie Collard M.D.   On: 06/07/2015 22:58   Ct Head Wo Contrast  06/07/2015   CLINICAL DATA:  49 year old female with right arm numbness and dizziness.  EXAM: CT HEAD WITHOUT CONTRAST  TECHNIQUE:  Contiguous axial images were obtained from the base of the skull through the vertex without intravenous contrast.  COMPARISON:  None.  FINDINGS: The ventricles and the sulci are appropriate in size for the patient's age. There is no intracranial hemorrhage. No midline shift or mass effect identified. The gray-white matter differentiation is preserved.  The visualized paranasal sinuses and mastoid air cells are well aerated. Small left maxillary sinus retention cyst or polyp. The calvarium is intact.  IMPRESSION: No acute intracranial pathology.   Electronically Signed   By: Elgie Collard M.D.   On: 06/07/2015 22:11   I have personally reviewed and evaluated these images and lab results as part of my medical decision-making.   EKG Interpretation   Date/Time:  Sunday June 07 2015 18:16:36 EDT Ventricular Rate:  79 PR Interval:  150 QRS Duration: 88 QT Interval:  424 QTC Calculation: 486 R Axis:   23 Text Interpretation:  Normal sinus rhythm Cannot rule out Anterior infarct  , age undetermined Abnormal ECG Confirmed by Lincoln Brigham [phone]) on  06/07/2015 8:18:22 PM      MDM   Final diagnoses:  Hyponatremia  Acute UTI    Patient here for evaluation of dizziness, paresthesia, dysuria. She has no focal neurologic deficits on examination. Lab analysis demonstrates hyponatremia, unclear etiology. Patient has been drinking a lot of water today. She is euvolemic on  examination. No evidence of acute CHF or acute renal insufficiency. Patient feels improved after IV fluids in the emergency department. Presentation is not consistent with acute CVA, subarachnoid hemorrhage, space occupying lesion. Discussed with patient home care for dizziness, hyponatremia, UTI. Will change patients prescribed antibiotic from Cipro to Keflex given her hyponatremia.  Discussed close outpatient follow-up as well as very close return precautions.    Tilden Fossa, MD 06/08/15 [phone]

## 2015-06-07 NOTE — Patient Instructions (Signed)
It seems that you have a UTI- take the cipro twice a day for 7- 10 days.   I will be in touch with your urine culture asap Go ahead and proceed to [facility] for further evaluation of your other symptoms- I will give them a call

## 2015-06-07 NOTE — ED Notes (Signed)
Onset this morning dysuria, then started having dizziness- feels like room spinning, lower mid abd pain and numbness to medial right hand and lower arm.  Pt went to u/c today and was dx: with UTI, sent to ED for other symptoms.

## 2015-06-07 NOTE — ED Notes (Signed)
Pt ambulated to and from RR successfully without assistance.

## 2015-06-07 NOTE — Progress Notes (Signed)
[facility] [address] [place] [address] [phone]  Date:  06/07/2015   Silva:  Mallory Silva   DOB:  1966-02-26   MRN:  [id number]  PCP:  Kaleen Mask, MD    Chief Complaint: Dizziness; Dysuria; Abdominal Pain; and Flu Vaccine   History of Present Illness:  Mallory Silva is a 49 y.o. very pleasant female patient who presents with the following:  Here today as a new patient with concern of possible UTI She noted pain with urination when she awoke this am.   However she also feels like she is not able to pass urine easily.  She does not have any bladder or belly pain to suggest urinary retention.   She "felt kind of dizzy and groggy headed."  This is persistent now She is taking a cough syrup recently for illness (? with codeine )  She has been pushing fluid and drank some cranberry juice.  She states that she is "not able to go," she normally has to urinate frequently but today has hardly gone at all  Just started this am No recent catheter or surgery  She is taking allegra D, robitussin DM, delsym recently  She also notes some numbness in her right 4th and 5th fingers- this has been present today and seemed to get worse after we drew blood today.    Also noted anemia today- most recent Hg in chart was normal but 3 years ago.  She endorses past history of anemia but is not sure about recent blood counts  She has has multiple abd operations  Patient Active Problem List   Diagnosis Date Noted  . 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 Lumbar post-laminectomy syndrome 01/03/2012    Past Medical History  Diagnosis Date  . Lumbar post-laminectomy syndrome   . Chronic pain syndrome   . Depression   . Anxiety     Past Surgical History  Procedure Laterality Date  . Tonsilectomy, adenoidectomy, bilateral myringotomy and tubes    . Cesarean section    . Miscarriage    . Polypectomy    .  Gastric bypass    . Hernia repair    . Tummy tuck    . Abdominal hysterectomy    . Spine surgery    . Cholecystectomy      Social History  Substance Use Topics  . Smoking status: Never Smoker   . Smokeless tobacco: None  . Alcohol Use: No    Family History  Problem Relation Age of Onset  . Cancer Mother   . Alzheimer's disease Mother   . Chronic fatigue Mother   . Cancer Father   . Cancer Sister   . Chronic fatigue Sister   . Thyroid disease Sister   . Depression Sister   . Diabetes Sister   . Hyperlipidemia Sister   . Hypertension Sister     Allergies  Allergen Reactions  . Nsaids Anaphylaxis  . Penicillins Itching and Rash  . Sulfa Antibiotics Rash  . Tape Rash    Medication list has been reviewed and updated.  Current Outpatient Prescriptions on File Prior to Visit  Medication Sig Dispense Refill  . acetaminophen (TYLENOL) 325 MG tablet Take 325 mg by mouth every 6 (six) hours as needed. For pain.    . clonazePAM (KLONOPIN) 2 MG tablet Take 2 mg by mouth at bedtime as needed. For insomnia and restless leg syndrome.    . diphenhydrAMINE (  BENADRYL) 25 MG tablet Take 25 mg by mouth every 6 (six) hours as needed. For allergies.     No current facility-administered medications on file prior to visit.    Review of Systems:  As per HPI- otherwise negative.   Physical Examination: Filed Vitals:   06/07/15 1549  BP: 152/80  Pulse: 96  Temp: 97.9 F (36.6 C)  Resp: 16   Filed Vitals:   06/07/15 1549  Height: 4' 11.5" (1.511 m)  Weight: 176 lb 3.2 oz (79.924 kg)   Body mass index is 35.01 kg/(m^2). Ideal Body Weight: Weight in (lb) to have BMI = 25: 125.6  GEN: WDWN, NAD, Non-toxic, A & O x 3, overweight, looks well HEENT: Atraumatic, Normocephalic. Neck supple. No masses, No LAD. Ears and Nose: No external deformity. CV: RRR, No M/G/R. No JVD. No thrill. No extra heart sounds. PULM: CTA B, no wheezes, crackles, rhonchi. No retractions. No resp.  distress. No accessory muscle use. ABD: S, NT, ND. No rebound. No HSM.  No tenderness over bladder, no apparent enlarged bladder EXTR: No c/c/e NEURO Normal gait.  No facial droop. Seems to have equal strength of her hands PSYCH: Normally interactive. Conversant. Not depressed or anxious appearing.  Calm demeanor.  Normal strength and DTR of all extremities  Results for orders placed or performed in visit on 06/07/15  POCT urinalysis dipstick  Result Value Ref Range   Color, UA yellow    Clarity, UA clear    Glucose, UA neg    Bilirubin, UA neg    Ketones, UA neg    Spec Grav, UA <=1.005    Blood, UA moderate    pH, UA 6.0    Protein, UA neg    Urobilinogen, UA 0.2    Nitrite, UA neg    Leukocytes, UA Trace (A) Negative  POCT UA - Microscopic Only  Result Value Ref Range   WBC, Ur, HPF, POC 0-2    RBC, urine, microscopic 0-4    Bacteria, U Microscopic few    Mucus, UA neg    Epithelial cells, urine per micros 0-1    Crystals, Ur, HPF, POC neg    Casts, Ur, LPF, POC neg    Yeast, UA neg   POCT CBC  Result Value Ref Range   WBC 11.1 (A) 4.6 - 10.2 K/uL   Lymph, poc 2.7 0.6 - 3.4   POC LYMPH PERCENT 24.0 10 - 50 %L   MID (cbc) 0.9 0 - 0.9   POC MID % 7.9 0 - 12 %M   POC Granulocyte 7.6 (A) 2 - 6.9   Granulocyte percent 68.1 37 - 80 %G   RBC 3.85 (A) 4.04 - 5.48 M/uL   Hemoglobin 9.9 (A) 12.2 - 16.2 g/dL   HCT, POC 16.1 (A) 09.6 - 47.9 %   MCV 80.2 80 - 97 fL   MCH, POC 25.7 (A) 27 - 31.2 pg   MCHC 32.1 31.8 - 35.4 g/dL   RDW, POC 04.5 %   Platelet Count, POC 331 142 - 424 K/uL   MPV 7.0 0 - 99.8 fL  POCT glucose (manual entry)  Result Value Ref Range   POC Glucose 96 70 - 99 mg/dl    Assessment and Plan: Dysuria - Plan: POCT urinalysis dipstick, POCT UA - Microscopic Only, Urine culture, POCT CBC, POCT glucose (manual entry), ciprofloxacin (CIPRO) 500 MG tablet  Paresthesias  Dizziness and giddiness  Anemia, unspecified anemia type  Here today with various  sx that do not fit together well. It seems that she likely has a UTI.  However she also complains of drinking copious amounts of water with little urination. No bladder tenderness or palpable enlargement but she may have retention.  She declines an in and out cath. She also has complaint of feeling dizzy and of numbness and tingling in her right hand.  These may certainly be benign, but she is concerned about these sx and would like further evaluation. She will go on to the ER She also has anemia of uncertain duration.  She denies any blood in her stool.   rx cipro for UTI that she can use assuming she is DC home from the hospital  rx for cipro for likely UTI, await culture We decided to have her go on to the ER for further evaluation beyond what we can do here.   Signed Abbe Amsterdam, MD

## 2015-06-08 NOTE — ED Notes (Signed)
Pt left with all belongings and ambulated out of treatment area with husband.  

## 2015-06-08 NOTE — Discharge Instructions (Signed)
DO NOT TAKE THE CIPROFLOXACIN.  Your sodium was low today (125), please get this rechecked by your family doctor in the next two days for recheck.  Get rechecked immediately if you have any new or worrisome symptoms.   Hyponatremia  Hyponatremia is when the amount of salt (sodium) in your blood is too low. When sodium levels are low, your cells will absorb extra water and swell. The swelling happens throughout the body, but it mostly affects the brain. Severe brain swelling (cerebral edema), seizures, or coma can happen.  CAUSES   Heart, kidney, or liver problems.  Thyroid problems.  Adrenal gland problems.  Severe vomiting and diarrhea.  Certain medicines or illegal drugs.  Dehydration.  Drinking too much water.  Low-sodium diet. SYMPTOMS   Nausea and vomiting.  Confusion.  Lethargy.  Agitation.  Headache.  Twitching or shaking (seizures).  Unconsciousness.  Appetite loss.  Muscle weakness and cramping. DIAGNOSIS  Hyponatremia is identified by a simple blood test. Your caregiver will perform a history and physical exam to try to find the cause and type of hyponatremia. Other tests may be needed to measure the amount of sodium in your blood and urine. TREATMENT  Treatment will depend on the cause.   Fluids may be given through the vein (IV).  Medicines may be used to correct the sodium imbalance. If medicines are causing the problem, they will need to be adjusted.  Water or fluid intake may be restricted to restore proper balance. The speed of correcting the sodium problem is very important. If the problem is corrected too fast, nerve damage (sometimes unchangeable) can happen. HOME CARE INSTRUCTIONS   Only take medicines as directed by your caregiver. Many medicines can make hyponatremia worse. Discuss all your medicines with your caregiver.  Carefully follow any recommended diet, including any fluid restrictions.  You may be asked to repeat lab tests. Follow  these directions.  Avoid alcohol and recreational drugs. SEEK MEDICAL CARE IF:   You develop worsening nausea, fatigue, headache, confusion, or weakness.  Your original hyponatremia symptoms return.  You have problems following the recommended diet. SEEK IMMEDIATE MEDICAL CARE IF:   You have a seizure.  You faint.  You have ongoing diarrhea or vomiting. MAKE SURE YOU:   Understand these instructions.  Will watch your condition.  Will get help right away if you are not doing well or get worse. Document Released: 09/02/2002 Document Revised: 12/05/2011 Document Reviewed: 02/27/2011 [facility] Patient Information 2015 [place], [place]. This information is not intended to replace advice given to you by your health care provider. Make sure you discuss any questions you have with your health care provider.  Urinary Tract Infection Urinary tract infections (UTIs) can develop anywhere along your urinary tract. Your urinary tract is your body's drainage system for removing wastes and extra water. Your urinary tract includes two kidneys, two ureters, a bladder, and a urethra. Your kidneys are a pair of bean-shaped organs. Each kidney is about the size of your fist. They are located below your ribs, one on each side of your spine. CAUSES Infections are caused by microbes, which are microscopic organisms, including fungi, viruses, and bacteria. These organisms are so small that they can only be seen through a microscope. Bacteria are the microbes that most commonly cause UTIs. SYMPTOMS  Symptoms of UTIs may vary by age and gender of the patient and by the location of the infection. Symptoms in young women typically include a frequent and intense urge to urinate and  a painful, burning feeling in the bladder or urethra during urination. Older women and men are more likely to be tired, shaky, and weak and have muscle aches and abdominal pain. A fever may mean the infection is in your kidneys. Other  symptoms of a kidney infection include pain in your back or sides below the ribs, nausea, and vomiting. DIAGNOSIS To diagnose a UTI, your caregiver will ask you about your symptoms. Your caregiver also will ask to provide a urine sample. The urine sample will be tested for bacteria and white blood cells. White blood cells are made by your body to help fight infection. TREATMENT  Typically, UTIs can be treated with medication. Because most UTIs are caused by a bacterial infection, they usually can be treated with the use of antibiotics. The choice of antibiotic and length of treatment depend on your symptoms and the type of bacteria causing your infection. HOME CARE INSTRUCTIONS  If you were prescribed antibiotics, take them exactly as your caregiver instructs you. Finish the medication even if you feel better after you have only taken some of the medication.  Drink enough water and fluids to keep your urine clear or pale yellow.  Avoid caffeine, tea, and carbonated beverages. They tend to irritate your bladder.  Empty your bladder often. Avoid holding urine for long periods of time.  Empty your bladder before and after sexual intercourse.  After a bowel movement, women should cleanse from front to back. Use each tissue only once. SEEK MEDICAL CARE IF:   You have back pain.  You develop a fever.  Your symptoms do not begin to resolve within 3 days. SEEK IMMEDIATE MEDICAL CARE IF:   You have severe back pain or lower abdominal pain.  You develop chills.  You have nausea or vomiting.  You have continued burning or discomfort with urination. MAKE SURE YOU:   Understand these instructions.  Will watch your condition.  Will get help right away if you are not doing well or get worse. Document Released: 06/22/2005 Document Revised: 03/13/2012 Document Reviewed: 10/21/2011 [facility] Patient Information 2015 [place], [place]. This information is not intended to replace advice given to  you by your health care provider. Make sure you discuss any questions you have with your health care provider.

## 2015-06-10 LAB — URINE CULTURE

## 2016-03-04 ENCOUNTER — Encounter: Payer: Self-pay | Admitting: Allergy and Immunology

## 2016-03-04 ENCOUNTER — Ambulatory Visit (INDEPENDENT_AMBULATORY_CARE_PROVIDER_SITE_OTHER): Payer: BLUE CROSS/BLUE SHIELD | Admitting: Allergy and Immunology

## 2016-03-04 VITALS — BP 130/88 | HR 86 | Temp 98.4°F | Resp 18 | Ht <= 58 in | Wt 190.8 lb

## 2016-03-04 DIAGNOSIS — K130 Diseases of lips: Secondary | ICD-10-CM | POA: Diagnosis not present

## 2016-03-04 DIAGNOSIS — J4520 Mild intermittent asthma, uncomplicated: Secondary | ICD-10-CM | POA: Diagnosis not present

## 2016-03-04 DIAGNOSIS — L299 Pruritus, unspecified: Secondary | ICD-10-CM

## 2016-03-04 DIAGNOSIS — R1310 Dysphagia, unspecified: Secondary | ICD-10-CM

## 2016-03-04 DIAGNOSIS — R220 Localized swelling, mass and lump, head: Secondary | ICD-10-CM

## 2016-03-04 NOTE — Progress Notes (Signed)
NEW PATIENT NOTE  RE: Mallory Silva MRN: [id number] DOB: 07/05/1966 [facility] [address]  [place] [id number] Date of Office Visit: 03/04/2016  Dear Dr. Windle GuardWilson Elkins:    I had the pleasure of seeing Rinaldo Cloudamela today in initial evaluation, as you recall Subjective:  Mallory Sofiaamela F Duffett is a 50 y.o. female who presents today for New Patient (Initial Visit)  Assessment:   1. Mild intermittent asthma.  2. Lip swelling associated allergic reaction with unclear etiology.  3. History of hypertension on lisinopril.  4. Allergic rhinoconjunctivitis.  5.      NSAID allergy--- avoidance and place. Plan:  No orders of the defined types were placed in this encounter.   Patient Instructions  1.  Avoidance: of berries as discussed and NSAIDS as previously. 2.  Antihistamine: Allegra 180mg  by mouth once daily for runny nose or itching. 3.  Nasal Spray: Rhinocort AQ 1-2 spray(s) each nostril once daily for stuffy nose or drainage.  4.  As needed Pro air HFA 2 puffs every 4 hours-- call with recurring use, consider Singulair trial. 5.  Epi-pen and benadryl as needed. 6.  Nasal Saline wash each evening at shower time. 7.  If new episodes--document environment, exposure, activity and ingestion.--- Consider trial off lisinopril. 8.  Labs at [facility] as discussed--- specific IgE foods.  9.  Follow up Visit: one month or sooner if needed.  HPI:  Rinaldo Cloudamela, who has a history of allergic rhinoconjunctivitis, intermittent asthma and NSAID allergy (previous evaluation in 2009 with Dr. Lucie LeatherKozlow) presents with new concerns after recent swelling episodes.  She describes difficulty, clearly twice in the month of May-- initially with an episode of lip swelling. The episode began late morning prior to lunch with tingling sensation and significant fullness to both lips-- treated with prednisone from primary MD.  She recalls preceding meal of  Blueberry/mixed berry Greek yogurt.  The  second episode occurred about May 29th, shortly after eating vanilla yogurt with sprinkled cinnamon/ginger and an apple. She.  She had greater difficulty with the second episode of throat tightening/irritation, dysphagia, cough and voice changes which prompted Benadryl use and the restart of leftover prednisone.  Often for breakfast she has had Honey nut Cheerios with Almond milk (rarely hot breakfast of sausage, eggs and bacon), but does not recall those on either occasions.  She denies any preceding febrile illness or associated respiratory, GI symptoms or generalized cutaneous changes. She is unsure of prior night's dinner, but typically includes a variety of foods chicken, vegetables beef or pork and she does recall a tick bite in the past.  Denies ED or Urgent care visits or antibiotic courses.  Year-round, she maintains on antihistamines for recurring rhinorrhea, congestion, sneezing, itchy watery eyes particularly more prominent in the spring/and fall or if around cats, which also trigger cough and wheeze episodes.  Albuterol use is typically related to her son and daughter-in-law's home where there are cats.  Medical History: Past Medical History  Diagnosis Date  . Lumbar post-laminectomy syndrome   . Chronic pain syndrome   . Depression   . Anxiety    Surgical History: Past Surgical History  Procedure Laterality Date  . Tonsilectomy, adenoidectomy, bilateral myringotomy and tubes    . Cesarean section    . Miscarriage    . Polypectomy    . Gastric bypass    . Hernia repair    . Tummy tuck    . Abdominal hysterectomy    . Spine  surgery    . Cholecystectomy     Family History: Family History  Problem Relation Age of Onset  . Cancer Mother   . Alzheimer's disease Mother   . Chronic fatigue Mother   . Asthma Mother   . Cancer Father   . Cancer Sister   . Chronic fatigue Sister   . Thyroid disease Sister   . Depression Sister   . Diabetes Sister   . Hyperlipidemia Sister     . Hypertension Sister   . Allergic rhinitis Sister   . Allergic rhinitis Brother   . Eczema Grandchild   . Allergic rhinitis Son   . Eczema Son   . Allergic rhinitis Son    Social History: Social History  . Marital Status: Married    Spouse Name: N/A  . Number of Children: 2  . Years of Education: N/A   Social History Main Topics  . Smoking status: Never Smoker   . Smokeless tobacco: Not on file  . Alcohol Use: Yes  . Drug Use: No  . 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  Taniesha has a current medication list which includes the following prescription(s): acetaminophen, albuterol, cetirizine, clonazepam, diphenhydramine, epipen 2-pak, estradiol, estradiol, fexofenadine, lisinopril-hydrochlorothiazide, melatonin, omeprazole.   Drug Allergies: Allergies  Allergen Reactions  . Nsaids Anaphylaxis  . Other Shortness Of Breath, Swelling and Other (See Comments)    CAT DANDER (CATS AND/OR CAT HAIR ON CLOTHES, ETC)  . Penicillins Itching and Rash  . Sulfa Antibiotics Rash  . Tape Rash   Environmental History: Alanna lives in a 52year old house for 25 years with wood/tile floors, with central heat and air; stuffed mattress, non-feather pillow/comforter, indoor dog without humidifier or smokers.   Review of Systems  Constitutional: Negative for fever, weight loss and malaise/fatigue.  HENT: Positive for congestion. Negative for ear pain, hearing loss, nosebleeds and sore throat.   Eyes: Negative for discharge and redness.  Respiratory: Negative for shortness of breath.        Denies history of bronchitis and pneumonia.  Gastrointestinal: Negative for heartburn, nausea, vomiting, abdominal pain, diarrhea and constipation.  Genitourinary: Negative.   Musculoskeletal: Negative for myalgias and joint pain.  Skin: Negative.  Negative for itching and rash.  Neurological: Negative.   Negative for dizziness, seizures, weakness and headaches.  Endo/Heme/Allergies: Positive for environmental allergies.       Denies sensitivity to stinging insects, foods, latex, jewelry and cosmetics.  Immunological: No chronic or recurring infections. Objective:   Filed Vitals:   03/04/16 1356  BP: 130/88  Pulse: 86  Temp: 98.4 F (36.9 C)  Resp: 18   SpO2 Readings from Last 1 Encounters:  03/04/16 97%   Physical Exam  Constitutional: She is well-developed, well-nourished, and in no distress.  HENT:  Head: Atraumatic.  Right Ear: Tympanic membrane and ear canal normal.  Left Ear: Tympanic membrane and ear canal normal.  Nose: Mucosal edema present. No rhinorrhea. No epistaxis.  Mouth/Throat: Oropharynx is clear and moist and mucous membranes are normal. No oropharyngeal exudate, posterior oropharyngeal edema or posterior oropharyngeal erythema.  Eyes: Conjunctivae are normal.  Neck: Neck supple.  Cardiovascular: Normal rate, S1 normal and S2 normal.   No murmur heard. Pulmonary/Chest: Effort normal. She has no wheezes. She has no rhonchi. She has no rales.  Abdominal: Soft. Normal appearance and bowel sounds are normal.  Musculoskeletal: She exhibits no  edema.  Lymphadenopathy:    She has no cervical adenopathy.  Neurological: She is alert.  Skin: Skin is warm and intact. No rash noted. No cyanosis. Nails show no clubbing.  No Dermatographism.   Diagnostics: Spirometry:  FVC  2.51--- 105%, FEV1 2.14--- 104%.    Skin testing:  Negative to selected foods.    Roselyn M. Willa Rough, MD   cc: Kaleen Mask, MD

## 2016-03-04 NOTE — Patient Instructions (Addendum)
Take Home Sheet  1. Avoidance: of berries as discussed and NSAIDS as previously.   2. Antihistamine: Allegra 180mg  by mouth once daily for runny nose or itching.   3. Nasal Spray: Rhinocort AQ 1-2 spray(s) each nostril once daily for stuffy nose or drainage.    4. ProAir HFA 2 puffs every 4 hours as needed for cough or wheeze.  5.  Epi-pen/benadryl as needed.   6. Nasal Saline wash each evening at shower time.  7.  If new episodes--document environment, exposure, activity and ingestion.--- Consider trial off lisinopril.  8.  [facility] as discussed--- specific IgE foods.   9. Follow up Visit: one month or sooner if needed.   Websites that have reliable Patient information: 1. American Academy of Asthma, Allergy, & Immunology: www.aaaai.org 2. Food Allergy Network: www.foodallergy.org 3. Mothers of Asthmatics: www.aanma.org 4. [facility]: https://www.strong.com/www.njc.org 5. American College of Allergy, Asthma, & Immunology: Bigger[employer]ards.iswww.allergy.mcg.edu or www.acaai.org

## 2016-03-07 LAB — ALLERGEN, RASPBERRY, RF343: Allergen, Raspberry, Rf343: <0.1 kU/L

## 2016-03-07 LAB — ALLERGEN, CINNAMON, RF220: Allergen, Cinnamon, Rf220: <0.1 kU/L

## 2016-03-07 LAB — ALLERGY PANEL 18, NUT MIX GROUP
Almonds: <0.1 kU/L
Coconut: <0.1 kU/L
Hazelnut: <0.1 kU/L
Sesame Seed f10: <0.1 kU/L

## 2016-03-07 LAB — ALLERGEN, APPLE F49: Apple: <0.1 kU/L

## 2016-03-07 LAB — ALLERGEN, GINGER, RF270

## 2016-03-07 LAB — ALLERGEN, VANILLA, RF234

## 2016-03-07 LAB — ALLERGEN, BLUEBERRY, RF288

## 2016-03-09 LAB — ALPHA-GAL PANEL
Allergen, Mutton, f88: <0.1 kU/L
Allergen, Pork, f26: <0.1 kU/L
GALACTOSE-ALPHA-1, 3-GALACTOSE IGE: 0.44 kU/L — AB (ref <0.35)

## 2016-04-15 ENCOUNTER — Ambulatory Visit: Payer: BLUE CROSS/BLUE SHIELD | Admitting: Allergy and Immunology

## 2016-04-15 ENCOUNTER — Telehealth: Payer: Self-pay

## 2016-04-15 NOTE — Telephone Encounter (Signed)
Patient cx her appt for today with Dr. Willa RoughHicks because she is having some problems with her stomach. She would like to know the results of her lab work.   Please Advise  Thanks

## 2016-04-18 NOTE — Telephone Encounter (Signed)
Labs have been printed from [facility] since they are not in epic. Contacted patient that labs will be reviewed by Dr Willa Rough and we would contact her when they are ready. Dr Willa Rough please advise labs sent to [facility]

## 2016-04-18 NOTE — Telephone Encounter (Signed)
Pt used Mallory Silva

## 2016-04-18 NOTE — Telephone Encounter (Signed)
Left message to call back when and where did she go get labs since they are not in epic

## 2016-04-19 NOTE — Telephone Encounter (Signed)
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  She is now off lisinopril, blood pressure has been good.  Only concern was eye lid changes related to nortriptyline, which she is now off.    atient reports eating beef and pork without issue, but recently decreased for weight management. She is following closely with her primary MD, monitoring weight and blood pressure. Patient does have Epi-pen and aware of appropriate use. Did have tick bite in May 2017, therefore will avoid beef/pork/lamb/deer for now. Recommend follow-up in office in next 2-3 months with repeat alpha gal labs. Patient agreed with plan.

## 2016-04-22 ENCOUNTER — Encounter: Payer: Self-pay | Admitting: Allergy and Immunology

## 2016-11-11 ENCOUNTER — Encounter (HOSPITAL_COMMUNITY): Payer: Self-pay

## 2016-11-11 ENCOUNTER — Emergency Department (HOSPITAL_COMMUNITY)
Admission: EM | Admit: 2016-11-11 | Discharge: 2016-11-12 | Disposition: A | Discharge status: Home / Self Care | Payer: BLUE CROSS/BLUE SHIELD | Attending: Emergency Medicine | Admitting: Emergency Medicine

## 2016-11-11 DIAGNOSIS — R55 Syncope and collapse: Secondary | ICD-10-CM | POA: Diagnosis not present

## 2016-11-11 LAB — URINALYSIS, ROUTINE W REFLEX MICROSCOPIC
BILIRUBIN URINE: NEGATIVE
Glucose, UA: NEGATIVE mg/dL
HGB URINE DIPSTICK: NEGATIVE
Ketones, ur: NEGATIVE mg/dL
Leukocytes, UA: NEGATIVE
Nitrite: NEGATIVE
PH: 6 (ref 5.0–8.0)
Protein, ur: NEGATIVE mg/dL
SPECIFIC GRAVITY, URINE: 1.005 (ref 1.005–1.030)

## 2016-11-11 LAB — BASIC METABOLIC PANEL
Anion gap: 9 (ref 5–15)
BUN: 6 mg/dL (ref 6–20)
CHLORIDE: 107 mmol/L (ref 101–111)
CO2: 24 mmol/L (ref 22–32)
CREATININE: 0.85 mg/dL (ref 0.44–1.00)
Calcium: 9.2 mg/dL (ref 8.9–10.3)
GFR calc Af Amer: >60 mL/min (ref >60)
GFR calc non Af Amer: >60 mL/min (ref >60)
Glucose, Bld: 82 mg/dL (ref 65–99)
POTASSIUM: 3.5 mmol/L (ref 3.5–5.1)
SODIUM: 140 mmol/L (ref 135–145)

## 2016-11-11 LAB — CBG MONITORING, ED: GLUCOSE-CAPILLARY: 92 mg/dL (ref 65–99)

## 2016-11-11 MED ORDER — SODIUM CHLORIDE 0.9 % IV BOLUS (SEPSIS)
1000.0000 mL | Freq: Once | INTRAVENOUS | Status: AC
  Administered 2016-11-12: 1000 mL via INTRAVENOUS

## 2016-11-11 NOTE — ED Provider Notes (Signed)
[facility]-EMER[facility]ENCY DEPT Provider Note   CSN: [id number] Arrival date & time: 11/11/16  1631     History   Chief Complaint Chief Complaint  Patient presents with  . Near Syncope    HPI Mallory Silva is a 51 y.o. female who presents with near syncope. PMH significant for anxiety, restless leg syndrome, insomnia. She states that several weeks ago she had some dental work done and was prescribed Percocet. She is also on Clonopin for insomnia and states her PCP took her off this temporarily because he did not want her mixing these medicines. He prescribed her Mirapex instead. She stopped the Clonopin one week ago and started the Mirapex in the same day. For the past two days she states she has been unable to sleep and has had a lot of energy. She states today she went to pick something up for her husband and she acutely felt like she was going to pass out. She pulled her car in to a UC. She reports associated AVH (although none currently), lightheadedness, and feeling off balance. She states that her vitals at [facility] was pulse 114 and she was hypertensive. She has hx of HTN but is not currently being treated with meds. Currently she feels fatigued and has generalized weakness. She denies headache, chest pain, SOB, abdominal pain. No confusion.  HPI  Past Medical History:  Diagnosis Date  . Anxiety   . Chronic pain syndrome   . Depression   . Lumbar post-laminectomy syndrome     Patient Active Problem List   Diagnosis Date Noted  . 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 Lumbar post-laminectomy syndrome 01/03/2012    Past Surgical History:  Procedure Laterality Date  . ABDOMINAL HYSTERECTOMY    . CESAREAN SECTION    . CHOLECYSTECTOMY    . GASTRIC BYPASS    . HERNIA REPAIR    . miscarriage    . POLYPECTOMY    . SPINE SURGERY    . TONSILECTOMY, ADENOIDECTOMY, BILATERAL MYRINGOTOMY AND TUBES    . tummy tuck      OB History    No data  available       Home Medications    Prior to Admission medications   Medication Sig Start Date End Date Taking? Authorizing Provider  acetaminophen (TYLENOL) 325 MG tablet Take 325 mg by mouth every 6 (six) hours as needed. For pain.   Yes Historical Provider, MD  albuterol (PROAIR HFA) 108 (90 Base) MCG/ACT inhaler Inhale 2 puffs into the lungs every 6 (six) hours as needed for wheezing or shortness of breath (allergic reactions).    Yes Historical Provider, MD  cetirizine (ZYRTEC) 10 MG tablet Take 10 mg by mouth at bedtime.   Yes Historical Provider, MD  clonazePAM (KLONOPIN) 1 MG tablet Take 1 mg by mouth at bedtime. For restless legs   Yes Historical Provider, MD  diphenhydrAMINE (BENADRYL) 25 MG tablet Take 25-50 mg by mouth every 6 (six) hours as needed (allergic reaction).   Yes Historical Provider, MD  EPINEPHrine (EPIPEN 2-PAK) 0.3 mg/0.3 mL IJ SOAJ injection Inject 0.3 mg into the muscle once as needed (severe allergic reaction).   Yes Historical Provider, MD  estradiol (ESTRACE) 0.5 MG tablet Take 0.5 mg by mouth daily.   Yes Historical Provider, MD  Estradiol (VAGIFEM) 10 MCG TABS vaginal tablet Place 10 mcg vaginally daily as needed (vaginal dryness).    Yes Historical Provider, MD  fexofenadine (ALLEGRA) 180 MG tablet Take 180  mg by mouth daily.   Yes Historical Provider, MD  Melatonin 3 MG TBDP Take 3 mg by mouth at bedtime.    Yes Historical Provider, MD  omeprazole (PRILOSEC) 20 MG capsule Take 20 mg by mouth daily.   Yes Historical Provider, MD  OVER THE COUNTER MEDICATION Take 2 tablets by mouth daily as needed (pain). OTC - Percogesic -   Yes Historical Provider, MD  ondansetron (ZOFRAN) 4 MG tablet Take 1 tablet (4 mg total) by mouth every 6 (six) hours. Patient not taking: Reported on 03/04/2016 06/07/15   Tilden FossaElizabeth Rees, MD    Family History Family History  Problem Relation Age of Onset  . Cancer Mother   . Alzheimer's disease Mother   . Chronic fatigue Mother   .  Asthma Mother   . Cancer Father   . Cancer Sister   . Chronic fatigue Sister   . Thyroid disease Sister   . Depression Sister   . Diabetes Sister   . Hyperlipidemia Sister   . Hypertension Sister   . Allergic rhinitis Sister   . Allergic rhinitis Brother   . Eczema Grandchild   . Allergic rhinitis Son   . Eczema Son   . Allergic rhinitis Son     Social History Social History  Substance Use Topics  . Smoking status: Never Smoker  . Smokeless tobacco: Never Used  . Alcohol use Yes     Allergies   Nsaids; Other; Lisinopril; Mirapex [pramipexole dihydrochloride]; Nortriptyline; Penicillins; Sulfa antibiotics; and Tape   Review of Systems Review of Systems  Constitutional: Positive for fatigue. Negative for chills and fever.  Eyes: Positive for visual disturbance.  Respiratory: Negative for shortness of breath.   Cardiovascular: Negative for chest pain.  Gastrointestinal: Negative for abdominal pain.  Neurological: Positive for dizziness, weakness (generalized) and light-headedness. Negative for syncope and headaches.  Psychiatric/Behavioral: Positive for decreased concentration, hallucinations and sleep disturbance. Negative for confusion.  All other systems reviewed and are negative.    Physical Exam Updated Vital Signs BP (!) 190/101   Pulse 65   Temp 98.5 F (36.9 C) (Oral)   Resp 14   SpO2 100%   Physical Exam  Constitutional: She is oriented to person, place, and time. She appears well-developed and well-nourished. No distress.  HENT:  Head: Normocephalic and atraumatic.  Eyes: Conjunctivae are normal. Pupils are equal, round, and reactive to light. Right eye exhibits no discharge. Left eye exhibits no discharge. No scleral icterus.  Neck: Normal range of motion.  Cardiovascular: Normal rate and regular rhythm.  Exam reveals no gallop and no friction rub.   No murmur heard. Pulmonary/Chest: Effort normal and breath sounds normal. No respiratory distress.  She has no wheezes. She has no rales. She exhibits no tenderness.  Abdominal: Soft. Bowel sounds are normal. She exhibits no distension and no mass. There is no tenderness. There is no rebound and no guarding. No hernia.  Neurological: She is alert and oriented to person, place, and time.  Skin: Skin is warm and dry.  Psychiatric: She has a normal mood and affect. Her behavior is normal.  Nursing note and vitals reviewed.    ED Treatments / Results  Labs (all labs ordered are listed, but only abnormal results are displayed) Labs Reviewed  URINALYSIS, ROUTINE W REFLEX MICROSCOPIC - Abnormal; Notable for the following:       Result Value   Color, Urine STRAW (*)    All other components within normal limits  CBC WITH DIFFERENTIAL/PLATELET -  Abnormal; Notable for the following:    RBC 3.62 (*)    Hemoglobin 10.2 (*)    HCT 30.7 (*)    RDW 16.4 (*)    All other components within normal limits  BASIC METABOLIC PANEL  CBG MONITORING, ED    EKG  EKG Interpretation  Date/Time:  Friday November 11 2016 16:52:34 EST Ventricular Rate:  67 PR Interval:  152 QRS Duration: 86 QT Interval:  398 QTC Calculation: 420 R Axis:   24 Text Interpretation:  Normal sinus rhythm Normal ECG no acute changes  Confirmed by LIU MD, Annabelle Harman ([id number]) on 11/11/2016 10:38:35 PM       Radiology No results found.  Procedures Procedures (including critical care time)  Medications Ordered in ED Medications  sodium chloride 0.9 % bolus 1,000 mL (0 mLs Intravenous Stopped 11/12/16 0207)  LORazepam (ATIVAN) tablet 1 mg (1 mg Oral Given 11/12/16 0137)     Initial Impression / Assessment and Plan / ED Course  I have reviewed the triage vital signs and the nursing notes.  Pertinent labs & imaging results that were available during my care of the patient were reviewed by me and considered in my medical decision making (see chart for details).  51 year old female with near syncope. Likely multifactorial due  to Clonopin withdrawal, Mirapex side effects, and not sleeping for 2 weeks. She is overall well appearing. Vitals remarkable for markedly elevated blood pressure. Otherwise vitals ar normal. EKG is NSR. CBC remarkable for anemia. BMP unremarkable. UA clean. Will treat with Ativan. Shared visit with Dr. Manus Gunning. TTS consult ordered due to AVH.  Patient states she is feeling better and ready to go home. TTS recommends outpatient tx. BP is still elevated but improved. She has appt with her PCP on Monday. Return precautions given.  Final Clinical Impressions(s) / ED Diagnoses   Final diagnoses:  Near syncope    New Prescriptions Discharge Medication List as of 11/12/2016  4:41 AM       Bethel Born, PA-C 11/12/16 [id number]    Glynn Octave, MD 11/12/16 [phone]

## 2016-11-11 NOTE — ED Notes (Signed)
Pt reports that she has not slept for two days, has visual/audio hallucinations, not eating well.  Pt reports she became very lightheaded, confusion, felt like her heart was racing and like she was going to pass out.  Had been withdrawaling from clonopin.

## 2016-11-11 NOTE — ED Triage Notes (Signed)
Pt states near syncopal episode. Pt states recently d/c'd from clonazepam for restless leg syndrome. Pt states has not slept in 2 days. Pt a/o x 4. Denies any CP or SOB. Pt complaining of fatigue and weakness.

## 2016-11-12 LAB — CBC WITH DIFFERENTIAL/PLATELET
BASOS ABS: 0 10*3/uL (ref 0.0–0.1)
BASOS PCT: 0 %
EOS ABS: 0.2 10*3/uL (ref 0.0–0.7)
Eosinophils Relative: 2 %
HCT: 30.7 % — ABNORMAL LOW (ref 36.0–46.0)
Hemoglobin: 10.2 g/dL — ABNORMAL LOW (ref 12.0–15.0)
LYMPHS PCT: 27 %
Lymphs Abs: 2 10*3/uL (ref 0.7–4.0)
MCH: 28.2 pg (ref 26.0–34.0)
MCHC: 33.2 g/dL (ref 30.0–36.0)
MCV: 84.8 fL (ref 78.0–100.0)
Monocytes Absolute: 0.4 10*3/uL (ref 0.1–1.0)
Monocytes Relative: 6 %
Neutro Abs: 4.8 10*3/uL (ref 1.7–7.7)
Neutrophils Relative %: 65 %
PLATELETS: 278 10*3/uL (ref 150–400)
RBC: 3.62 MIL/uL — AB (ref 3.87–5.11)
RDW: 16.4 % — AB (ref 11.5–15.5)
WBC: 7.5 10*3/uL (ref 4.0–10.5)

## 2016-11-12 MED ORDER — LORAZEPAM 1 MG PO TABS
1.0000 mg | ORAL_TABLET | Freq: Once | ORAL | Status: AC
  Administered 2016-11-12: 1 mg via ORAL
  Filled 2016-11-12: qty 1

## 2016-11-12 MED ORDER — ONDANSETRON 4 MG PO TBDP: 8.0000 mg | ORAL_TABLET | Freq: Once | ORAL | Status: DC

## 2016-11-12 NOTE — ED Provider Notes (Signed)
By signing my name below, I, Clovis PuAvnee Patel, attest that this documentation has been prepared under the direction and in the presence of Glynn OctaveStephen Nelline Lio, MD  Electronically Signed: Clovis PuAvnee Patel, ED Scribe. 11/12/16. 1:29 AM.  Blood pressure (!) 190/101, pulse 65, temperature 98.5 F (36.9 C), temperature source Oral, resp. rate 14, SpO2 100 %.  HPI Comments:  Mallory Silva is a 51 y.o. female who presents to the Emergency Department complaining of a near syncopal episode and palpitations onset yesterday. Pt also reports she hasn't slept in 2 days, visual and auditory hallucinations onset 2 days. Pt was on clonopin, 1 mg daily, for restless leg syndrome but was temporarily taken off of this medication by PCP because she was taking percocet after dental work. She reports last clonopin use was on 11/03/16. She notes she was placed on Mirapex and and last took this medication in the PM on 11/10/2016. Pt states she thinks her symptoms are a side affect of the Mirapex. She denies suicidal ideation, homicidal ideation, chest pain, SOB, current hallucinations, lightheadedness, dizziness and any other associated symptoms. Pt notes occasion alcohol use.   PE: Alert and oriented x 3. Lungs CTAB. Heart RRR. No tremors noted. Cranial nerves 2-12 intact. 5/5 strength throughout, no ataxia on finger to nose. Romberg neg. Normal gait. No nystagmus.  No SI or HI.  No evidence of life threatening benzodiazepine withdrawal.  Suspect medication side effect.   I personally performed the services described in this documentation, which was scribed in my presence. The recorded information has been reviewed and is accurate.     Glynn OctaveStephen Ravina Milner, MD 11/12/16 [phone]

## 2016-11-12 NOTE — BH Assessment (Addendum)
Tele Assessment Note   Mallory Silva is an 51 y.o. married female who presents to [facility] ED with near syncope. She states that several weeks ago she had some dental work done and was prescribed Percocet. Pt says she has been on Klonopin a few years for treatment of restless leg syndrome. Pt states her PCP took her off Klonopin and prescribed her Mirapex instead. She stopped the Klonopin one on 11/03/16 and started the Mirapex. Pt says since this medication change she has had episodes of confusion and episode of auditory and visual hallucinations. She reports for the past three days she has experienced insomnia and yesterday she was hyperverbal with excessive energy. Pt report today states today she was driving and felt like she was going to pass out so she pulled over. Pt says her mood has been good and she denies depressive symptoms. She denies current suicidal ideation or history of suicide attempts. She denies homicidal ideation or history of violence. She denies any alcohol or substance abuse.  Pt cannot identify any specific stressors. She attributes her recent symptoms as a reaction the the change of medications. Pt says she lives with her husband and has good family support. She says other than seeing a psychiatrist for depression four years ago she has no mental health history.  Pt is sitting in hospital bed, alert, oriented x4 with normal speech and normal motor behavior. Eye contact is good. Pt's mood is euthymic and affect is congruent with mood. Thought process is coherent and relevant. There is no indication Pt is currently responding to internal stimuli or experiencing delusional thought content. Pt was calm and pleasant throughout assessment.  Diagnosis: Deferred  Past Medical History:  Past Medical History:  Diagnosis Date  . Anxiety   . Chronic pain syndrome   . Depression   . Lumbar post-laminectomy syndrome     Past Surgical History:  Procedure Laterality Date  .  ABDOMINAL HYSTERECTOMY    . CESAREAN SECTION    . CHOLECYSTECTOMY    . GASTRIC BYPASS    . HERNIA REPAIR    . miscarriage    . POLYPECTOMY    . SPINE SURGERY    . TONSILECTOMY, ADENOIDECTOMY, BILATERAL MYRINGOTOMY AND TUBES    . tummy tuck      Family History:  Family History  Problem Relation Age of Onset  . Cancer Mother   . Alzheimer's disease Mother   . Chronic fatigue Mother   . Asthma Mother   . Cancer Father   . Cancer Sister   . Chronic fatigue Sister   . Thyroid disease Sister   . Depression Sister   . Diabetes Sister   . Hyperlipidemia Sister   . Hypertension Sister   . Allergic rhinitis Sister   . Allergic rhinitis Brother   . Eczema Grandchild   . Allergic rhinitis Son   . Eczema Son   . Allergic rhinitis Son     Social History:  reports that she has never smoked. She has never used smokeless tobacco. She reports that she drinks alcohol. She reports that she does not use drugs.  Additional Social History:  Alcohol / Drug Use Pain Medications: Denies abuse Prescriptions: See MAR Over the Counter: See MAR History of alcohol / drug use?: No history of alcohol / drug abuse Longest period of sobriety (when/how long): NA  CIWA: CIWA-Ar BP: 176/91 Pulse Rate: (!) 58 COWS:    PATIENT STRENGTHS: (choose at least two) Ability for insight Active sense  of humor Average or above average intelligence Capable of independent living Communication skills Financial means General fund of knowledge Motivation for treatment/growth Physical Health Supportive family/friends Work skills  Allergies:  Allergies  Allergen Reactions  . Nsaids Anaphylaxis  . Other Shortness Of Breath, Swelling and Other (See Comments)    CAT DANDER (CATS AND/OR CAT HAIR ON CLOTHES, ETC)  . Lisinopril Swelling    Eyelids and lip swelling  . Mirapex [Pramipexole Dihydrochloride] Other (See Comments)    Caused insomnia and wheezing  . Nortriptyline Other (See Comments)    Unknown  allergic reaction  . Penicillins Itching and Rash    Has patient had a PCN reaction causing immediate rash, facial/tongue/throat swelling, SOB or lightheadedness with hypotension: Yes Has patient had a PCN reaction causing severe rash involving mucus membranes or skin necrosis: No Has patient had a PCN reaction that required hospitalization No Has patient had a PCN reaction occurring within the last 10 years: No If all of the above answers are "NO", then may proceed with Cephalosporin use.  . Sulfa Antibiotics Rash  . Tape Rash    Please use paper tape    Home Medications:  (Not in a hospital admission)  OB/GYN Status:  No LMP recorded. Patient has had a hysterectomy.  General Assessment Data [facility] Assessment: In system Is this a Tele or Face-to-Face Assessment?: Tele Assessment Is this an Initial Assessment or a Re-assessment for this encounter?: Initial Assessment Marital status: [place] Is patient pregnant?: No Pregnancy Status: No Living Arrangements: Spouse/significant other Can pt return to current living arrangement?: Yes Admission Status: Voluntary Is patient capable of signing voluntary admission?: Yes Referral Source: Self/Family/Friend Insurance type: BCBS     Crisis Care Plan Living Arrangements: Spouse/significant other Legal Guardian: Other: (Self) Name of Psychiatrist: None Name of Therapist: None  Education Status Is patient currently in school?: No Current Grade: NA Highest grade of school patient has completed: 12 Name of school: NA Contact person: NA  Risk to self with the past 6 months Suicidal Ideation: No Has patient been a risk to self within the past 6 months prior to admission? : No Suicidal Intent: No Has patient had any suicidal intent within the past 6 months prior to admission? : No Is patient at risk for suicide?: No Suicidal Plan?: No Has patient had any suicidal plan within the past 6  months prior to admission? : No Access to Means: No What has been your use of drugs/alcohol within the last 12 months?: Pt denies Previous Attempts/Gestures: No How many times?: 0 Other Self Harm Risks: None Triggers for Past Attempts: None known Intentional Self Injurious Behavior: None Family Suicide History: No Recent stressful life event(s): Other (Comment) (None) Persecutory voices/beliefs?: No Depression: No Depression Symptoms: Insomnia Substance abuse history and/or treatment for substance abuse?: No Suicide prevention information given to non-admitted patients: Not applicable  Risk to Others within the past 6 months Homicidal Ideation: No Does patient have any lifetime risk of violence toward others beyond the six months prior to admission? : No Thoughts of Harm to Others: No Current Homicidal Intent: No Current Homicidal Plan: No Access to Homicidal Means: No Identified Victim: None History of harm to others?: No Assessment of Violence: None Noted Violent Behavior Description: None Does patient have access to weapons?: Yes (Comment) Criminal Charges Pending?: No Does patient have a court date: No Is patient on probation?: No  Psychosis Hallucinations: Auditory, Visual (Recent hallucinations, no current  symptoms) Delusions: None noted  Mental Status Report Appearance/Hygiene: Unremarkable Eye Contact: Good Motor Activity: Unremarkable Speech: Logical/coherent Level of Consciousness: Alert Mood: Pleasant Affect: Appropriate to circumstance Anxiety Level: None Thought Processes: Coherent, Relevant Judgement: Unimpaired Orientation: Person, Place, Time, Situation, Appropriate for developmental age Obsessive Compulsive Thoughts/Behaviors: None  Cognitive Functioning Concentration: Normal Memory: Recent Intact, Remote Intact IQ: Average Insight: Good Impulse Control: Good Appetite: Good Weight Loss: 0 Weight Gain: 0 Sleep: Decreased Total Hours of  Sleep: 0 (Very little sleep in almost three days) Vegetative Symptoms: None  ADLScreening [facility] Assessment Services) Patient's cognitive ability adequate to safely complete daily activities?: Yes Patient able to express need for assistance with ADLs?: Yes Independently performs ADLs?: Yes (appropriate for developmental age)  Prior Inpatient Therapy Prior Inpatient Therapy: No Prior Therapy Dates: NA Prior Therapy Facilty/Provider(s): NA Reason for Treatment: NA  Prior Outpatient Therapy Prior Outpatient Therapy: Yes Prior Therapy Dates: 2014 Prior Therapy Facilty/Provider(s): Unknown Reason for Treatment: Depression Does patient have an ACCT team?: No Does patient have Intensive In-House Services?  : No Does patient have [facility] services? : No Does patient have P4CC services?: No  ADL Screening (condition at time of admission) Patient's cognitive ability adequate to safely complete daily activities?: Yes Is the patient deaf or have difficulty hearing?: No Does the patient have difficulty seeing, even when wearing glasses/contacts?: No Does the patient have difficulty concentrating, remembering, or making decisions?: No Patient able to express need for assistance with ADLs?: Yes Does the patient have difficulty dressing or bathing?: No Independently performs ADLs?: Yes (appropriate for developmental age) Does the patient have difficulty walking or climbing stairs?: No Weakness of Legs: None Weakness of Arms/Hands: None  Home Assistive Devices/Equipment Home Assistive Devices/Equipment: None    Abuse/Neglect Assessment (Assessment to be complete while patient is alone) Physical Abuse: Denies Verbal Abuse: Denies Sexual Abuse: Denies Exploitation of patient/patient's resources: Denies Self-Neglect: Denies     Merchant navy officer (For Healthcare) Does Patient Have a Medical Advance Directive?: No Would patient like information on creating a medical advance directive?: No -  Patient declined    Additional Information 1:1 In Past 12 Months?: No CIRT Risk: No Elopement Risk: No Does patient have medical clearance?: Yes     Disposition: Gave clinical report to Nira Conn, NP who said Pt does not meet criteria for inpatient psychiatric treatment and recommends Pt follow up with current outpatient providers. Notified Bethel Born, PA-C and Daun Peacock, RN of recommendation.  Disposition Initial Assessment Completed for this Encounter: Yes Disposition of Patient: Other dispositions Other disposition(s): Other (Comment)   Pamalee Leyden, [facility], [facility], [facility] Triage Specialist [phone]   Patsy Baltimore, Harlin Rain 11/12/2016 2:34 AM

## 2016-11-12 NOTE — ED Notes (Signed)
Tts at bedside 

## 2016-11-12 NOTE — Discharge Instructions (Signed)
Please follow up with your PCP Return for worsening symptoms

## 2017-08-25 ENCOUNTER — Telehealth: Payer: Self-pay | Admitting: Allergy and Immunology

## 2017-08-25 NOTE — Telephone Encounter (Signed)
Called and it rang once then just beeped several times, was unable to leave voicemail.

## 2017-08-25 NOTE — Telephone Encounter (Signed)
Patient called and is broken out on many spots on her body. She was last seen 03-04-2016 by Dr. Willa RoughHicks. She made an appointment for Monday with Dr. Dellis AnesGallagher. She has been to her primary care and they gave her prednisone and she doesn't think it is helping. They told her to contact us since she was seen here before. She would like to know if you can offer her any advice on what to do, for relief, between now and Monday.

## 2017-08-28 ENCOUNTER — Emergency Department (HOSPITAL_COMMUNITY)
Admission: EM | Admit: 2017-08-28 | Discharge: 2017-08-28 | Disposition: A | Discharge status: Home / Self Care | Payer: BLUE CROSS/BLUE SHIELD | Attending: Emergency Medicine | Admitting: Emergency Medicine

## 2017-08-28 ENCOUNTER — Other Ambulatory Visit: Payer: Self-pay

## 2017-08-28 ENCOUNTER — Encounter: Payer: Self-pay | Admitting: Allergy & Immunology

## 2017-08-28 ENCOUNTER — Ambulatory Visit (INDEPENDENT_AMBULATORY_CARE_PROVIDER_SITE_OTHER): Payer: BLUE CROSS/BLUE SHIELD | Admitting: Allergy & Immunology

## 2017-08-28 ENCOUNTER — Encounter (HOSPITAL_COMMUNITY): Payer: Self-pay

## 2017-08-28 VITALS — BP 120/80 | HR 72 | Resp 16 | Ht 58.25 in | Wt 188.2 lb

## 2017-08-28 DIAGNOSIS — T7840XA Allergy, unspecified, initial encounter: Secondary | ICD-10-CM | POA: Diagnosis present

## 2017-08-28 DIAGNOSIS — J3089 Other allergic rhinitis: Secondary | ICD-10-CM

## 2017-08-28 DIAGNOSIS — R21 Rash and other nonspecific skin eruption: Secondary | ICD-10-CM | POA: Insufficient documentation

## 2017-08-28 DIAGNOSIS — L509 Urticaria, unspecified: Secondary | ICD-10-CM | POA: Insufficient documentation

## 2017-08-28 DIAGNOSIS — L508 Other urticaria: Secondary | ICD-10-CM

## 2017-08-28 DIAGNOSIS — J4520 Mild intermittent asthma, uncomplicated: Secondary | ICD-10-CM | POA: Insufficient documentation

## 2017-08-28 DIAGNOSIS — Z880 Allergy status to penicillin: Secondary | ICD-10-CM | POA: Diagnosis not present

## 2017-08-28 DIAGNOSIS — Z79899 Other long term (current) drug therapy: Secondary | ICD-10-CM | POA: Diagnosis not present

## 2017-08-28 DIAGNOSIS — J302 Other seasonal allergic rhinitis: Secondary | ICD-10-CM

## 2017-08-28 LAB — CBC
HEMATOCRIT: 40 % (ref 36.0–46.0)
HEMOGLOBIN: 13.5 g/dL (ref 12.0–15.0)
MCH: 30.9 pg (ref 26.0–34.0)
MCHC: 33.8 g/dL (ref 30.0–36.0)
MCV: 91.5 fL (ref 78.0–100.0)
Platelets: 236 10*3/uL (ref 150–400)
RBC: 4.37 MIL/uL (ref 3.87–5.11)
RDW: 13 % (ref 11.5–15.5)
WBC: 11.1 10*3/uL — AB (ref 4.0–10.5)

## 2017-08-28 LAB — BASIC METABOLIC PANEL
ANION GAP: 10 (ref 5–15)
BUN: 12 mg/dL (ref 6–20)
CALCIUM: 8.7 mg/dL — AB (ref 8.9–10.3)
CHLORIDE: 102 mmol/L (ref 101–111)
CO2: 25 mmol/L (ref 22–32)
Creatinine, Ser: 0.94 mg/dL (ref 0.44–1.00)
GFR calc non Af Amer: >60 mL/min (ref >60)
Glucose, Bld: 108 mg/dL — ABNORMAL HIGH (ref 65–99)
POTASSIUM: 3.8 mmol/L (ref 3.5–5.1)
Sodium: 137 mmol/L (ref 135–145)

## 2017-08-28 LAB — CBG MONITORING, ED: Glucose-Capillary: 107 mg/dL — ABNORMAL HIGH (ref 65–99)

## 2017-08-28 MED ORDER — DIPHENHYDRAMINE HCL 25 MG PO CAPS: 25.0000 mg | ORAL_CAPSULE | Freq: Once | ORAL | Status: DC

## 2017-08-28 MED ORDER — EPINEPHRINE 0.3 MG/0.3ML IJ SOAJ: 0.3000 mg | Freq: Once | INTRAMUSCULAR | 1 refills | Status: DC

## 2017-08-28 MED ORDER — FAMOTIDINE 20 MG PO TABS: 40.0000 mg | ORAL_TABLET | Freq: Once | ORAL | Status: DC

## 2017-08-28 MED ORDER — EPINEPHRINE 0.3 MG/0.3ML IJ SOAJ: INTRAMUSCULAR | 1 refills | Status: DC

## 2017-08-28 MED ORDER — PREDNISONE 20 MG PO TABS
20.0000 mg | ORAL_TABLET | Freq: Once | ORAL | Status: AC
  Administered 2017-08-28: 20 mg via ORAL
  Filled 2017-08-28: qty 1

## 2017-08-28 MED ORDER — DIPHENHYDRAMINE HCL 50 MG/ML IJ SOLN
50.0000 mg | Freq: Once | INTRAMUSCULAR | Status: AC
  Administered 2017-08-28: 50 mg via INTRAMUSCULAR
  Filled 2017-08-28: qty 1

## 2017-08-28 NOTE — Patient Instructions (Addendum)
1. Chronic urticaria - Your history does not have any "red flags" such as fevers, joint pains, or permanent skin changes that would be concerning for a more serious cause of hives.  - We will get some labs to rule out serious causes of hives: complete blood count, tryptase level, chronic urticaria panel, CMP, ESR, and CRP. - Chronic hives are often times a self limited process and will "burn themselves out" over 6-12 months, although this is not always the case.  - We will start you on a longer prednisone taper since stopping the steroids abruptly causes rebound hives: '20mg'$  twice daily for four days, '10mg'$  twice daily for four days, '10mg'$  once daily for four days, then STOP - In the meantime, start suppressive dosing of antihistamines:   - Morning: Allegra (fexofenadine) '360mg'$  (two tablets) OR Xyzal (levocetirizine) '10mg'$  (two tablets)  - Evening: Zyrtec (cetirizine) '20mg'$  (two tablets)  - If the above is not working, try adding: Zantac (ranitidine) '300mg'$  (two tablets) - You can change this dosing at home, decreasing the dose as needed or increasing the dosing as needed.  - If you are not tolerating the medications or are tired of taking them every day, we can start treatment with a monthly injectable medication called Xolair.  Wynona Luna script sent in.   2. Return in about 3 months (around 11/26/2017).  Please inform us of any Emergency Department visits, hospitalizations, or changes in symptoms. Call us before going to the ED for breathing or allergy symptoms since we might be able to fit you in for a sick visit. Feel free to contact us anytime with any questions, problems, or concerns.  It was a pleasure to meet you today! Enjoy the holiday season!  Websites that have reliable patient information: 1. American Academy of Asthma, Allergy, and Immunology: www.aaaai.org 2. Food Allergy Research and Education (FARE): foodallergy.org 3. Mothers of Asthmatics: http://www.asthmacommunitynetwork.org 4.  American College of Allergy, Asthma, and Immunology: www.acaai.org

## 2017-08-28 NOTE — Telephone Encounter (Signed)
Patient called this morning complaining on hives head to toe, wheezing, right hand swelling, bottom lip swelling, throat issues?. She has used Allegra, Zyrtec, and Benadryl. Patient did have EpiPen on hand, but that had expired in April 2018. I asked about the solution being clear or cloudy. She said that it looked dark, not clear. I advised her that if she felt as if she was able to drive to the emergency room then do so. I told her that if she did not feel as if she could make it on her own to call 911. I advised her to let the triage/check in know of all the symptoms she told me that she is having. Patient acknowledged understanding of these recommendations and is going to the emergency department to be seen. I told her that we would leave the appointment at 3 pm with Dr. Dellis AnesGallagher for now and if there was need to cancel later today then we would. Dr. Dellis AnesGallagher has been informed of all information.

## 2017-08-28 NOTE — Progress Notes (Signed)
FOLLOW UP  Date of Service/Encounter:  08/28/17   Assessment:   Chronic idiopathic urticaria  Mild intermittent asthma, uncomplicated  Plan/Recommendations:   1. Chronic urticaria - Your history does not have any "red flags" such as fevers, joint pains, or permanent skin changes that would be concerning for a more serious cause of hives.  - We will get some labs to rule out serious causes of hives: complete blood count, tryptase level, chronic urticaria panel, CMP, ESR, and CRP. - Chronic hives are often times a self limited process and will "burn themselves out" over 6-12 months, although this is not always the case.  - We will start you on a longer prednisone taper since stopping the steroids abruptly causes rebound hives: '20mg'$  twice daily for four days, '10mg'$  twice daily for four days, '10mg'$  once daily for four days, then STOP - In the meantime, start suppressive dosing of antihistamines:   - Morning: Allegra (fexofenadine) '360mg'$  (two tablets) OR Xyzal (levocetirizine) '10mg'$  (two tablets)  - Evening: Zyrtec (cetirizine) '20mg'$  (two tablets)  - If the above is not working, try adding: Zantac (ranitidine) '300mg'$  (two tablets) - You can change this dosing at home, decreasing the dose as needed or increasing the dosing as needed.  - If you are not tolerating the medications or are tired of taking them every day, we can start treatment with a monthly injectable medication called Xolair.  Wynona Luna script sent in and teaching provided.  2. Intermittent asthma - Spirometry looked normal today. - Continue with albuterol as needed.   3. Return in about 3 months (around 11/26/2017).  Subjective:   Mallory Silva is a 51 y.o. female presenting today for follow up of  Chief Complaint  Patient presents with  . Urticaria    severe 1 month ago    Mallory Silva has a history of the following: Patient Active Problem List   Diagnosis Date Noted  . 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 Lumbar post-laminectomy syndrome 01/03/2012    History obtained from: chart review and patient and her husband.  [facility] Provider is Mallory Downing, MD.     [name] is a 51 y.o. female presenting for a sick visit.  She evidently comes to see [place] every 1-2 years.  Her last visit was in June 2017.  She was seen by Dr. Ishmael Holter for new episodes of swelling.  She was continued on Allegra 180 mg daily as well as Rhinocort 1-2 sprays per nostril daily.  Her EpiPen was refilled.  She was told to document environment and exposures in a journal and follow-up in 1 month.  She had a slew of labs that showed only a minimal positive to alpha gal of 0.44.  Additional food IgEs including rasberry, vanilla, ginger, apple, blueberry, tree nuts, and cinnamon were all negative. The swelling episodes were thought to be related to her blood pressure medication, therefore her lisinopril was stopped.   Since the last visit, the swelling episodes have improved. However, this morning she developed swelling of her bottom lip and tingling of her throat. She had a "funny feeling" and was having trouble swallowing. She did call us for an appointment, but given the constellation of symptoms, we routed her to the ED in case she was experiencing anaphylaxis. She was seen in the ED and she got to "wait in the waiting room all day". They gave her prednisone and an antihistamine shot. This  improved. She had tried Allegra, famotidine, and Benadryl without improvement.   This current episode started two days after voting day. She was placed on six days of prednisone ('4mg'$  daily) with improvement. Then se was put on 12 days of prednisone ('4mg'$ ), which she finished over the weekend. This was all done by her PCP. She was then called in '20mg'$  prednisone this morning from her PCP. She was having the swelling only one year ago, but the emergence of the hives is new for her.    She  typically takes Allegra in the morning and Zyrtec at night on a routine basis. This typically falres in the morning. She does have an NSAID allergy, but she has been avoiding this. She has not recently had any red meat; this is not a typical thing that she eats. She has eaten red meat since the last visit without any symptoms at all, however. She was under the impression that the lab from the last visit was not relevant and has therefore not paid much attention to that at all.   Otherwise, there have been no changes to her past medical history, surgical history, family history, or social history.    Review of Systems: a 14-point review of systems is pertinent for what is mentioned in HPI.  Otherwise, all other systems were negative. Constitutional: negative other than that listed in the HPI Eyes: negative other than that listed in the HPI Ears, nose, mouth, throat, and face: negative other than that listed in the HPI Respiratory: negative other than that listed in the HPI Cardiovascular: negative other than that listed in the HPI Gastrointestinal: negative other than that listed in the HPI Genitourinary: negative other than that listed in the HPI Integument: negative other than that listed in the HPI Hematologic: negative other than that listed in the HPI Musculoskeletal: negative other than that listed in the HPI Neurological: negative other than that listed in the HPI Allergy/Immunologic: negative other than that listed in the HPI    Objective:   Blood pressure 120/80, pulse 72, resp. rate 16, height 4' 10.25" (1.48 m), weight 188 lb 3.2 oz (85.4 kg). Body mass index is 39 kg/m.   Physical Exam:  General: Alert, interactive, in no acute distress. Eyes: No conjunctival injection bilaterally, no discharge on the right, no discharge on the left and no Horner-Trantas dots present. PERRL bilaterally. EOMI without pain. No photophobia.  Ears: Right TM pearly gray with normal light  reflex, Left TM pearly gray with normal light reflex, Right TM intact without perforation and Left TM intact without perforation.  Nose/Throat: External nose within normal limits and septum midline. Turbinates markedly edematous and pale with clear discharge. Posterior oropharynx erythematous without cobblestoning in the posterior oropharynx. Tonsils 2+ without exudates.  Tongue without thrush. Adenopathy: no enlarged lymph nodes appreciated in the anterior cervical, occipital, axillary, epitrochlear, inguinal, or popliteal regions. Lungs: Clear to auscultation without wheezing, rhonchi or rales. No increased work of breathing. CV: Normal S1/S2. No murmurs. Capillary refill <2 seconds.  Skin: Scattered erythematous urticarial type lesions primarily located bilateral upper arms and neck. There were lesions on the abdomen that are resolving , nonvesicular. Neuro:   Grossly intact. No focal deficits appreciated. Responsive to questions.  Diagnostic studies:   Spirometry: results normal (FEV1: 2.13/98%, FVC: 2.50/91%, FEV1/FVC: 85%).    Spirometry consistent with normal pattern.   Allergy Studies: none       Salvatore Marvel, MD [facility]

## 2017-08-28 NOTE — ED Triage Notes (Signed)
Pt reports intermittent red raised rash scattered over her arms, hands and feet since the beginning of November. She states her PCP prescribed her benadryl and prednisone. The meds helped but finished the prednisone over the weekend and the rash came back. She states her throat feels "different" but is able to speak in clear complete sentences without difficulty swallowing her saliva. She also mentioned she felt as though her lips were swollen earlier today but that has since subsided. She is supposed to see an allergist today at 3pm.

## 2017-08-28 NOTE — Discharge Instructions (Signed)
Follow-up with your allergist today as planned

## 2017-08-28 NOTE — ED Provider Notes (Signed)
[facility] Provider Note   CSN: [id number] Arrival date & time: 08/28/17  1024     History   Chief Complaint Chief Complaint  Patient presents with  . Allergic Reaction  . Rash    HPI Mallory Sofiaamela F Silva is a 51 y.o. female.  HPI Pt has been having trouble with an allergic rash for the last month or so.  She first saw her doctor and was given a short course of prednisone.  Her smptoms got better with the prednisone but returned after she finished it.  SHe was supposed to see her allergist this am but she started to feel difficult with her breathing so they sent her to the emergenc room.  She took her prednisone, 20 mg.  She feels better as she has been waiting but not completel normal.  SHe has taken oral antihistamines as well.  She denies any difficulty speaking.  No difficulty swallowing she does not have any trouble breathing she is Past Medical History:  Diagnosis Date  . Anxiety   . Chronic pain syndrome   . Depression   . Lumbar post-laminectomy syndrome     Patient Active Problem List   Diagnosis Date Noted  . 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 Lumbar post-laminectomy syndrome 01/03/2012    Past Surgical History:  Procedure Laterality Date  . ABDOMINAL HYSTERECTOMY    . CESAREAN SECTION    . CHOLECYSTECTOMY    . GASTRIC BYPASS    . HERNIA REPAIR    . miscarriage    . POLYPECTOMY    . SPINE SURGERY    . TONSILECTOMY, ADENOIDECTOMY, BILATERAL MYRINGOTOMY AND TUBES    . tummy tuck      OB History    No data available       Home Medications    Prior to Admission medications   Medication Sig Start Date End Date Taking? Authorizing Provider  acetaminophen (TYLENOL) 325 MG tablet Take 325 mg by mouth every 6 (six) hours as needed. For pain.   Yes [provider]  albuterol (PROAIR HFA) 108 (90 Base) MCG/ACT inhaler Inhale 2 puffs into the lungs every 6 (six)  hours as needed for wheezing or shortness of breath (allergic reactions).    Yes [provider]  cetirizine (ZYRTEC) 10 MG tablet Take 10 mg by mouth at bedtime.   Yes [provider]  clonazePAM (KLONOPIN) 1 MG tablet Take 1 mg by mouth at bedtime. For restless legs   Yes [provider]  diphenhydrAMINE (BENADRYL) 25 MG tablet Take 25-50 mg by mouth every 6 (six) hours as needed (allergic reaction).   Yes [provider]  estradiol (ESTRACE) 0.5 MG tablet Take 0.5 mg by mouth daily.   Yes [provider]  Estradiol (VAGIFEM) 10 MCG TABS vaginal tablet Place 10 mcg vaginally daily as needed (vaginal dryness).    Yes [provider]  famotidine (PEPCID) 20 MG tablet Take 20 mg by mouth 2 (two) times daily.   Yes [provider]  fexofenadine (ALLEGRA) 180 MG tablet Take 180 mg by mouth daily.   Yes [provider]  Melatonin 3 MG TBDP Take 3 mg by mouth at bedtime.    Yes [provider]  omeprazole (PRILOSEC) 20 MG capsule Take 20 mg by mouth daily.   Yes [provider]  predniSONE (DELTASONE) 20 MG tablet Take 20 mg by mouth daily with breakfast.   Yes [provider]  Probiotic Product (PRO-BIOTIC BLEND PO) Take 1 capsule by mouth daily.   Yes [provider]  EPINEPHrine 0.3 mg/0.3 mL IJ SOAJ injection Inject 0.3 mLs (0.3 mg total) into the muscle once for 1 dose. 08/28/17 08/28/17  Linwood DibblesKnapp, Irja Wheless, MD  OVER THE COUNTER MEDICATION Take 2 tablets by mouth daily as needed (pain). OTC - Percogesic -    [provider]    Family History Family History  Problem Relation Age of Onset  . Cancer Mother   . Alzheimer's disease Mother   . Chronic fatigue Mother   . Asthma Mother   . Cancer Father   . Cancer Sister   . Chronic fatigue Sister   . Thyroid disease Sister   . Depression Sister   . Diabetes Sister   . Hyperlipidemia Sister   . Hypertension Sister   . Allergic rhinitis  Sister   . Allergic rhinitis Brother   . Eczema Grandchild   . Allergic rhinitis Son   . Eczema Son   . Allergic rhinitis Son     Social History Social History   Tobacco Use  . Smoking status: Never Smoker  . Smokeless tobacco: Never Used  Substance Use Topics  . Alcohol use: Yes  . Drug use: No     Allergies   Nsaids; Other; Lisinopril; Mirapex [pramipexole dihydrochloride]; Nortriptyline; Penicillins; Sulfa antibiotics; and Tape   Review of Systems Review of Systems  All other systems reviewed and are negative.    Physical Exam Updated Vital Signs BP 131/90 (BP Location: Right Arm)   Pulse 78   Temp 97.9 F (36.6 C) (Oral)   Resp 16   Ht 1.499 m (4\' 11" )   Wt 86.2 kg (190 lb)   SpO2 98%   BMI 38.38 kg/m   Physical Exam  Constitutional: She appears well-developed and well-nourished. No distress.  HENT:  Head: Normocephalic and atraumatic.  Right Ear: External ear normal.  Left Ear: External ear normal.  Mouth/Throat: Uvula is midline, oropharynx is clear and moist and mucous membranes are normal. No posterior oropharyngeal edema.  Eyes: Conjunctivae are normal. Right eye exhibits no discharge. Left eye exhibits no discharge. No scleral icterus.  Neck: Neck supple. No tracheal deviation present.  Cardiovascular: Normal rate, regular rhythm and intact distal pulses.  Pulmonary/Chest: Effort normal and breath sounds normal. No stridor. No respiratory distress. She has no wheezes. She has no rales.  Abdominal: Soft. Bowel sounds are normal. She exhibits no distension. There is no tenderness. There is no rebound and no guarding.  Musculoskeletal: She exhibits no edema or tenderness.  Neurological: She is alert. She has normal strength. No cranial nerve deficit (no facial droop, extraocular movements intact, no slurred speech) or sensory deficit. She exhibits normal muscle tone. She displays no seizure activity. Coordination normal.  Skin: Skin is warm and dry. No  rash noted.  Psychiatric: She has a normal mood and affect.  Nursing note and vitals reviewed.    ED Treatments / Results  Labs (all labs ordered are listed, but only abnormal results are displayed) Labs Reviewed  CBC - Abnormal; Notable for the following components:      Result Value   WBC 11.1 (*)    All other components within normal limits  BASIC METABOLIC PANEL - Abnormal; Notable for the following components:   Glucose, Bld 108 (*)    Calcium 8.7 (*)    All other components within normal limits  CBG MONITORING, ED - Abnormal; Notable for  the following components:   Glucose-Capillary 107 (*)    All other components within normal limits     Procedures Procedures (including critical care time)  Medications Ordered in ED Medications  diphenhydrAMINE (BENADRYL) injection 50 mg (50 mg Intramuscular Given 08/28/17 1431)  predniSONE (DELTASONE) tablet 20 mg (20 mg Oral Given 08/28/17 1429)     Initial Impression / Assessment and Plan / ED Course  I have reviewed the triage vital signs and the nursing notes.  Pertinent labs & imaging results that were available during my care of the patient were reviewed by me and considered in my medical decision making (see chart for details).   Patient presented to the emergency room for evaluation of an allergic reaction.  She has been having recurrent issues with urticaria.  She has some mild persistent urticaria on exam but is not showing any signs of angioedema.  She is able to speak easily.  No difficulty swallowing.  Patient has been waiting for couple hours prior to evaluation.  Her symptoms have not increased.  I do think it safe for her to follow-up with her allergist today.  She has an appointment in 20 minutes  Final Clinical Impressions(s) / ED Diagnoses   Final diagnoses:  Urticaria    ED Discharge Orders        Ordered    EPINEPHrine 0.3 mg/0.3 mL IJ SOAJ injection   Once     08/28/17 1439       Linwood Dibbles,  MD 08/28/17 1441

## 2017-08-29 ENCOUNTER — Telehealth: Payer: Self-pay | Admitting: *Deleted

## 2017-08-29 NOTE — Telephone Encounter (Signed)
[facility].

## 2017-08-29 NOTE — Telephone Encounter (Signed)
-----   Message from Alfonse SpruceJoel Louis Gallagher, MD sent at 08/28/2017  9:01 PM EST ----- Can someone talk to Vam[facility] to see if we can add a serum tryptase to her labs from yesterday? Thanks!

## 2017-08-30 LAB — TRYPTASE: TRYPTASE: 4.1 ug/L (ref 2.2–13.2)

## 2017-08-30 LAB — SPECIMEN STATUS REPORT

## 2017-09-07 ENCOUNTER — Telehealth: Payer: Self-pay | Admitting: Allergy & Immunology

## 2017-09-07 NOTE — Telephone Encounter (Signed)
Yesterday, she had one of her episodes. She did get the allergen avoidance measures performed, but she has not deep cleaned her room. Symptoms are the worst in the morning after she has been asleep. She continues to get the feeling in her throat and is interested in obtaining allergy shots. She remains on the Benadryl every four hours. She is unsure of the trigger at this point.   Of note, she did have pepperoni pizza last night. The dogs are outside dogs and never come in. We discussed the possibilities of starting allergy shots versus Xolair and she would like to proceed with both. I will have our staff send information on Xolair so she can consider this. We will have her make an appointment to start allergy shots.   Mallory BondsJoel Yitzel Shasteen, MD [facility]

## 2017-09-07 NOTE — Telephone Encounter (Signed)
Patient is calling about allergy injections Patient has finally decided to proceed with the allergy injections and has set up an appt for the end of December Patient did mention wanting to be seen sooner due to throat itching and feeling like it was closing Does the patient need allergy injections for this?? Does patient need [facility]?? Please contact the patient to answer any questions about her symptoms and what she is needing injections for whether it be hives or throat issues Thank you

## 2017-09-08 ENCOUNTER — Ambulatory Visit (INDEPENDENT_AMBULATORY_CARE_PROVIDER_SITE_OTHER): Payer: BLUE CROSS/BLUE SHIELD

## 2017-09-08 DIAGNOSIS — L501 Idiopathic urticaria: Secondary | ICD-10-CM

## 2017-09-08 DIAGNOSIS — L508 Other urticaria: Secondary | ICD-10-CM

## 2017-09-08 MED ORDER — OMALIZUMAB 150 MG ~~LOC~~ SOLR: 150.0000 mg | SUBCUTANEOUS | Status: DC

## 2017-09-08 MED ORDER — OMALIZUMAB 150 MG ~~LOC~~ SOLR
150.0000 mg | SUBCUTANEOUS | Status: DC
  Administered 2017-09-08: 150 mg via SUBCUTANEOUS

## 2017-09-08 NOTE — Addendum Note (Signed)
Addended by: Alfonse SpruceGALLAGHER, JOEL LOUIS on: 09/08/2017 06:51 AM   Modules accepted: Orders

## 2017-09-08 NOTE — Progress Notes (Signed)
We received notification from Ms. Mallory Silva that she is interested in pursuing allergen immunotherapy. Prescriptions written and routed to the Immunotherapy Team.   Malachi BondsJoel Bj Morlock, MD [id number]

## 2017-09-08 NOTE — Progress Notes (Signed)
Immunotherapy   Patient Details  Name: Marianne Sofiaamela F Cwikla MRN: [id number] Date of Birth: 10-24-1965  09/08/2017  Marianne SofiaPamela F Burbage started injections for  Xolair 150 mg. Frequency: every 28 days Epi-Pen:Epi-Pen Available  Consent signed and patient instructions given.  Pt waited 60 minutes in office with no issues.   Amber Wood 09/08/2017, 11:10 AM

## 2017-09-13 ENCOUNTER — Telehealth: Payer: Self-pay | Admitting: Allergy & Immunology

## 2017-09-13 ENCOUNTER — Encounter: Payer: Self-pay | Admitting: *Deleted

## 2017-09-13 DIAGNOSIS — J301 Allergic rhinitis due to pollen: Secondary | ICD-10-CM | POA: Diagnosis not present

## 2017-09-13 NOTE — Progress Notes (Signed)
VIALS MADE. EXP: 09-13-18. HV 

## 2017-09-13 NOTE — Telephone Encounter (Signed)
Pt called and said that when she try to come off the predisone her hives come back . [phone].

## 2017-09-13 NOTE — Telephone Encounter (Signed)
Pt is requesting instructions on Prednisone as she has did the taper however, her hives resume when she tapers to the 10mg  dose, Pt is concerned she will not have enough Prednisone through the holidays. Pt also took her first Xolair injection and had temporary relief but once tapering began the hives returned. Please advise. Thank you

## 2017-09-13 NOTE — Telephone Encounter (Signed)
Can you please ask her how much antihistamine and ranitidine she is taking daily? We can extend the prednisone at 20 mg for another 5 days for now until I can consult with Dr. Dellis AnesGallagher. Thank you.

## 2017-09-14 ENCOUNTER — Other Ambulatory Visit: Payer: Self-pay

## 2017-09-14 DIAGNOSIS — J3089 Other allergic rhinitis: Secondary | ICD-10-CM | POA: Diagnosis not present

## 2017-09-14 LAB — ALPHA GAL IGE: ALPHA GAL IGE: 0.22 kU/L — AB (ref <0.10)

## 2017-09-14 MED ORDER — MONTELUKAST SODIUM 10 MG PO TABS: 10.0000 mg | ORAL_TABLET | Freq: Every day | ORAL | 5 refills | Status: DC

## 2017-09-14 MED ORDER — RANITIDINE HCL 150 MG PO TABS: 300.0000 mg | ORAL_TABLET | Freq: Two times a day (BID) | ORAL | 3 refills | Status: DC

## 2017-09-14 MED ORDER — DOXEPIN HCL 10 MG PO CAPS: 10.0000 mg | ORAL_CAPSULE | Freq: Every day | ORAL | 0 refills | Status: DC

## 2017-09-14 NOTE — Telephone Encounter (Signed)
Can you please add on Singulair 10 mg daily, Zyrtec 10 mg twice a day, ranitidine 300 a day at night, and doxepin 10 mg at night. I will call Tammy V. and get her to increase the dose of Xolair. Thank you

## 2017-09-14 NOTE — Telephone Encounter (Signed)
All meds sent into Pt's pharmacy. Pt advised of instructions for dosing on all medications. Pt verified that she has Prednisone 20mg  doses to last until her next OV on 12/27.

## 2017-09-21 ENCOUNTER — Ambulatory Visit (INDEPENDENT_AMBULATORY_CARE_PROVIDER_SITE_OTHER): Payer: BLUE CROSS/BLUE SHIELD | Admitting: *Deleted

## 2017-09-21 DIAGNOSIS — J309 Allergic rhinitis, unspecified: Secondary | ICD-10-CM

## 2017-09-21 LAB — IGE+ALLERGENS ZONE 2(30)
Amer Sycamore IgE Qn: <0.1 kU/L
Bahia Grass IgE: 0.29 kU/L — AB
Cat Dander IgE: 8.94 kU/L — AB
Cladosporium Herbarum IgE: <0.1 kU/L
Cockroach, American IgE: 0.18 kU/L — AB
Common Silver Birch IgE: <0.1 kU/L
D001-IGE D PTERONYSSINUS: 0.33 kU/L — AB
E005-IGE DOG DANDER: 2.11 kU/L — AB
Elm, American IgE: <0.1 kU/L
G002-IGE BERMUDA GRASS: 0.83 kU/L — AB
IgE (Immunoglobulin E), Serum: 239 IU/mL — ABNORMAL HIGH (ref 0–100)
Johnson Grass IgE: <0.1 kU/L
Maple/Box Elder IgE: <0.1 kU/L
Mugwort IgE Qn: <0.1 kU/L
Oak, White IgE: <0.1 kU/L
Pigweed, Rough IgE: <0.1 kU/L
Plantain, English IgE: <0.1 kU/L
Ragweed, Short IgE: 2.37 kU/L — AB
Stemphylium Herbarum IgE: <0.1 kU/L
Sweet gum IgE RAST Ql: <0.1 kU/L
T006-IGE CEDAR, MOUNTAIN: 0.74 kU/L — AB
Timothy Grass IgE: 2.62 kU/L — AB
White Mulberry IgE: <0.1 kU/L

## 2017-09-21 LAB — THYROID ANTIBODIES
THYROID PEROXIDASE ANTIBODY: 16 [IU]/mL (ref 0–34)
Thyroglobulin Antibody: <1 IU/mL (ref 0.0–0.9)

## 2017-09-21 LAB — CHRONIC URTICARIA: cu index: <2.7 (ref <10)

## 2017-09-21 LAB — SEDIMENTATION RATE: SED RATE: 18 mm/h (ref 0–40)

## 2017-09-21 LAB — C-REACTIVE PROTEIN: CRP: 17.1 mg/L — AB (ref 0.0–4.9)

## 2017-09-21 LAB — ANA: ANA: NEGATIVE

## 2017-09-21 NOTE — Progress Notes (Signed)
Immunotherapy   Patient Details  Name: Mallory Sofiaamela F Rittenberry MRN: [id number] Date of Birth: [birth year]  09/21/2017  Mallory Silva started injections for  G-T-C-D & RW-CR-DM Following schedule: B  Frequency:2 times per week Epi-Pen:Epi-Pen Available  Consent signed and patient instructions given. No problems after 30 minutes in the office.   Mariane DuvalHeather L Venora Kautzman 09/21/2017, 6:37 PM

## 2017-09-25 ENCOUNTER — Ambulatory Visit (INDEPENDENT_AMBULATORY_CARE_PROVIDER_SITE_OTHER): Payer: BLUE CROSS/BLUE SHIELD

## 2017-09-25 DIAGNOSIS — J309 Allergic rhinitis, unspecified: Secondary | ICD-10-CM

## 2017-09-29 ENCOUNTER — Ambulatory Visit (INDEPENDENT_AMBULATORY_CARE_PROVIDER_SITE_OTHER): Payer: BLUE CROSS/BLUE SHIELD

## 2017-09-29 DIAGNOSIS — J309 Allergic rhinitis, unspecified: Secondary | ICD-10-CM | POA: Diagnosis not present

## 2017-10-03 ENCOUNTER — Ambulatory Visit (INDEPENDENT_AMBULATORY_CARE_PROVIDER_SITE_OTHER): Payer: BLUE CROSS/BLUE SHIELD | Admitting: *Deleted

## 2017-10-03 DIAGNOSIS — J309 Allergic rhinitis, unspecified: Secondary | ICD-10-CM

## 2017-10-06 ENCOUNTER — Ambulatory Visit: Payer: Self-pay

## 2017-10-10 ENCOUNTER — Ambulatory Visit (INDEPENDENT_AMBULATORY_CARE_PROVIDER_SITE_OTHER): Payer: BLUE CROSS/BLUE SHIELD | Admitting: *Deleted

## 2017-10-10 DIAGNOSIS — L501 Idiopathic urticaria: Secondary | ICD-10-CM | POA: Diagnosis not present

## 2017-10-10 DIAGNOSIS — L508 Other urticaria: Secondary | ICD-10-CM

## 2017-10-10 MED ORDER — OMALIZUMAB 150 MG ~~LOC~~ SOLR
300.0000 mg | SUBCUTANEOUS | Status: AC
  Administered 2017-10-10 – 2021-01-27 (×34): 300 mg via SUBCUTANEOUS

## 2017-10-12 ENCOUNTER — Ambulatory Visit (INDEPENDENT_AMBULATORY_CARE_PROVIDER_SITE_OTHER): Payer: BLUE CROSS/BLUE SHIELD

## 2017-10-12 ENCOUNTER — Other Ambulatory Visit: Payer: Self-pay | Admitting: Allergy & Immunology

## 2017-10-12 DIAGNOSIS — J309 Allergic rhinitis, unspecified: Secondary | ICD-10-CM | POA: Diagnosis not present

## 2017-10-20 ENCOUNTER — Ambulatory Visit (INDEPENDENT_AMBULATORY_CARE_PROVIDER_SITE_OTHER): Payer: BLUE CROSS/BLUE SHIELD

## 2017-10-20 DIAGNOSIS — J309 Allergic rhinitis, unspecified: Secondary | ICD-10-CM

## 2017-10-25 ENCOUNTER — Ambulatory Visit (INDEPENDENT_AMBULATORY_CARE_PROVIDER_SITE_OTHER): Payer: BLUE CROSS/BLUE SHIELD

## 2017-10-25 DIAGNOSIS — J309 Allergic rhinitis, unspecified: Secondary | ICD-10-CM

## 2017-11-02 ENCOUNTER — Ambulatory Visit (INDEPENDENT_AMBULATORY_CARE_PROVIDER_SITE_OTHER): Payer: BLUE CROSS/BLUE SHIELD | Admitting: *Deleted

## 2017-11-02 DIAGNOSIS — J309 Allergic rhinitis, unspecified: Secondary | ICD-10-CM | POA: Diagnosis not present

## 2017-11-07 ENCOUNTER — Ambulatory Visit (INDEPENDENT_AMBULATORY_CARE_PROVIDER_SITE_OTHER): Payer: BLUE CROSS/BLUE SHIELD | Admitting: *Deleted

## 2017-11-07 DIAGNOSIS — J309 Allergic rhinitis, unspecified: Secondary | ICD-10-CM | POA: Diagnosis not present

## 2017-11-09 ENCOUNTER — Ambulatory Visit (INDEPENDENT_AMBULATORY_CARE_PROVIDER_SITE_OTHER): Payer: BLUE CROSS/BLUE SHIELD | Admitting: *Deleted

## 2017-11-09 DIAGNOSIS — L508 Other urticaria: Secondary | ICD-10-CM

## 2017-11-17 ENCOUNTER — Other Ambulatory Visit: Payer: Self-pay | Admitting: Allergy & Immunology

## 2017-11-29 ENCOUNTER — Ambulatory Visit: Payer: BLUE CROSS/BLUE SHIELD | Admitting: Allergy & Immunology

## 2017-11-30 DIAGNOSIS — J309 Allergic rhinitis, unspecified: Secondary | ICD-10-CM

## 2017-12-01 ENCOUNTER — Ambulatory Visit (INDEPENDENT_AMBULATORY_CARE_PROVIDER_SITE_OTHER): Payer: BLUE CROSS/BLUE SHIELD | Admitting: *Deleted

## 2017-12-01 DIAGNOSIS — J309 Allergic rhinitis, unspecified: Secondary | ICD-10-CM

## 2017-12-06 ENCOUNTER — Ambulatory Visit (INDEPENDENT_AMBULATORY_CARE_PROVIDER_SITE_OTHER): Payer: BLUE CROSS/BLUE SHIELD | Admitting: *Deleted

## 2017-12-06 DIAGNOSIS — J309 Allergic rhinitis, unspecified: Secondary | ICD-10-CM

## 2017-12-07 ENCOUNTER — Other Ambulatory Visit: Payer: Self-pay | Admitting: Allergy & Immunology

## 2017-12-07 ENCOUNTER — Ambulatory Visit (INDEPENDENT_AMBULATORY_CARE_PROVIDER_SITE_OTHER): Payer: BLUE CROSS/BLUE SHIELD | Admitting: *Deleted

## 2017-12-07 DIAGNOSIS — L501 Idiopathic urticaria: Secondary | ICD-10-CM

## 2017-12-07 DIAGNOSIS — L508 Other urticaria: Secondary | ICD-10-CM

## 2017-12-12 ENCOUNTER — Ambulatory Visit (INDEPENDENT_AMBULATORY_CARE_PROVIDER_SITE_OTHER): Payer: BLUE CROSS/BLUE SHIELD | Admitting: *Deleted

## 2017-12-12 DIAGNOSIS — J309 Allergic rhinitis, unspecified: Secondary | ICD-10-CM | POA: Diagnosis not present

## 2017-12-25 ENCOUNTER — Ambulatory Visit (INDEPENDENT_AMBULATORY_CARE_PROVIDER_SITE_OTHER): Payer: BLUE CROSS/BLUE SHIELD | Admitting: *Deleted

## 2017-12-25 DIAGNOSIS — J309 Allergic rhinitis, unspecified: Secondary | ICD-10-CM

## 2018-01-03 ENCOUNTER — Ambulatory Visit (INDEPENDENT_AMBULATORY_CARE_PROVIDER_SITE_OTHER): Payer: BLUE CROSS/BLUE SHIELD | Admitting: *Deleted

## 2018-01-03 DIAGNOSIS — J309 Allergic rhinitis, unspecified: Secondary | ICD-10-CM

## 2018-01-04 ENCOUNTER — Ambulatory Visit (INDEPENDENT_AMBULATORY_CARE_PROVIDER_SITE_OTHER): Payer: BLUE CROSS/BLUE SHIELD | Admitting: Allergy & Immunology

## 2018-01-04 ENCOUNTER — Ambulatory Visit: Payer: BLUE CROSS/BLUE SHIELD

## 2018-01-04 ENCOUNTER — Encounter: Payer: Self-pay | Admitting: Allergy & Immunology

## 2018-01-04 VITALS — BP 138/80 | HR 80 | Temp 98.2°F | Resp 16

## 2018-01-04 DIAGNOSIS — J302 Other seasonal allergic rhinitis: Secondary | ICD-10-CM | POA: Diagnosis not present

## 2018-01-04 DIAGNOSIS — J3089 Other allergic rhinitis: Secondary | ICD-10-CM

## 2018-01-04 DIAGNOSIS — J4520 Mild intermittent asthma, uncomplicated: Secondary | ICD-10-CM

## 2018-01-04 DIAGNOSIS — L508 Other urticaria: Secondary | ICD-10-CM

## 2018-01-04 DIAGNOSIS — L501 Idiopathic urticaria: Secondary | ICD-10-CM | POA: Diagnosis not present

## 2018-01-04 MED ORDER — RANITIDINE HCL 150 MG PO TABS: 150.0000 mg | ORAL_TABLET | Freq: Every day | ORAL | 5 refills | Status: DC

## 2018-01-04 NOTE — Progress Notes (Signed)
FOLLOW UP  Date of Service/Encounter:  01/04/18   Assessment:   Seasonal and perennial allergic rhinitis  Chronic urticaria  Alpha gal allergy  Mild intermittent asthma without complication   Asthma Reportables:  Severity: intermittent  Risk: low Control: well controlled  Plan/Recommendations:   1. Chronic urticaria - Stop the doxepin at night to see how you do with that for TWO WEEKS. - Then, if you are doing well, stop the ranitidine for TWO WEEKS (you might need to restart omeprazole at this point).  - Then call us with an update. - We will plan to stop the Xolair in June after our next visit.  - In the meantime, start suppressive dosing of antihistamines:   - Morning: Zyrtec (cetirizine) 10mg    - Evening: Zyrtec (cetirizine) 10mg  + Singulair (montelukast) 10mg  daily + Zantac (ranitidine) 300mg  at night  - If the above is not working, try adding:   2. Perennial and seasonal allergic rhinitis  - Continue with allergy shots at the same schedule.   3. Intermittent asthma - Well controlled.  - Spirometry appears normal today.   4. Return in about 2 months (around 03/06/2018).  Subjective:   Mallory Silva is a 52 y.o. female presenting today for follow up of  Chief Complaint  Patient presents with  . Asthma  . Urticaria  . Medication Reaction    Discuss Xolair and Rantidine    Mallory Silva has a history of the following: Patient Active Problem List   Diagnosis Date Noted  . Chronic urticaria 08/28/2017  . Mild intermittent asthma, uncomplicated 08/28/2017  . 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 Lumbar post-laminectomy syndrome 01/03/2012    History obtained from: chart review and patient.  Mallory Silva is Mallory Silva.     Mallory Silva is a 52 y.o. female presenting for a follow up visit. She was last seen in December 2018 as a new patient. At that time, she was  having problems with chronic urticaria. We started her on suppressive antihistamines and obtained an extensive lab work. An environmental allergy panel demonstrated positives to dust mites, cat, dog, cockroach, grasses, trees, and ragweed. We did a workup for serious causes of hives and this was normal. We also added on an alpha-gal panel after an episode with pepperoni pizza, therefore we added on an alpha-gal panel which was slightly positive.   Since the last visit, she has mostly done well. She is on the allergy shots and seems to be better from that perspective. Hives have markedly improved since we figured out her regimen. She remains on the extensive antihistamine regimen in addition to the Singulair. She has even gotten red meat out of her diet. She has had no outbreaks since doing this and the addition of the Xolair has helped markedly as well.   [name] asthma has been well controlled. She has not required rescue medication, experienced nocturnal awakenings due to lower respiratory symptoms, nor have activities of daily living been limited. She has required no Emergency Department or Urgent Care visits for her asthma. She has required zero courses of systemic steroids for asthma exacerbations since the last visit. ACT score today is 23, indicating excellent asthma symptom control.   She does have some problems with the cat at her grandkids' home. She is very careful to avoid the cat dander when she is there. There were five at one point, but they have now put some of  them down due to age. She remains on the allergy shots and is doing well with these. She has had some localized reactions once or twice, but otherwise has been able to advance her injections. She feels that things are going very well.   Otherwise, there have been no changes to her past medical history, surgical history, family history, or social history.    Review of Systems: a 14-point review of systems is pertinent for what is  mentioned in HPI.  Otherwise, all other systems were negative. Constitutional: negative other than that listed in the HPI Eyes: negative other than that listed in the HPI Ears, nose, mouth, throat, and face: negative other than that listed in the HPI Respiratory: negative other than that listed in the HPI Cardiovascular: negative other than that listed in the HPI Gastrointestinal: negative other than that listed in the HPI Genitourinary: negative other than that listed in the HPI Integument: negative other than that listed in the HPI Hematologic: negative other than that listed in the HPI Musculoskeletal: negative other than that listed in the HPI Neurological: negative other than that listed in the HPI Allergy/Immunologic: negative other than that listed in the HPI    Objective:   Blood pressure 138/80, pulse 80, temperature 98.2 F (36.8 C), temperature source Oral, resp. rate 16, SpO2 97 %. There is no height or weight on file to calculate BMI.   Physical Exam:  General: Alert, interactive, in no acute distress. Pleasant female.  Eyes: No conjunctival injection bilaterally, no discharge on the right, no discharge on the left and no Horner-Trantas dots present. PERRL bilaterally. EOMI without pain. No photophobia.  Ears: Right TM pearly gray with normal light reflex, Left TM pearly gray with normal light reflex, Right TM intact without perforation and Left TM intact without perforation.  Nose/Throat: External nose within normal limits and septum midline. Turbinates edematous and pale with clear discharge. Posterior oropharynx erythematous without cobblestoning in the posterior oropharynx. Tonsils 2+ without exudates.  Tongue without thrush. Lungs: Clear to auscultation without wheezing, rhonchi or rales. No increased work of breathing. CV: Normal S1/S2. No murmurs. Capillary refill <2 seconds.  Skin: Warm and dry, without lesions or rashes. Neuro:   Grossly intact. No focal deficits  appreciated. Responsive to questions.  Diagnostic studies:   Spirometry: results normal (FEV1: 2.00/92%, FVC: 2.62/95%, FEV1/FVC: 76%).    Spirometry consistent with normal pattern.   Allergy Studies: none      Mallory Bonds, Silva [facility] [place]

## 2018-01-04 NOTE — Patient Instructions (Addendum)
1. Chronic urticaria - Stop the doxepin at night to see how you do with that for TWO WEEKS. - Then, if you are doing well, stop the ranitidine for TWO WEEKS (you might need to restart omeprazole at this point).  - Then call us with an update. - We will plan to stop the Xolair in June after our next visit.  - In the meantime, start suppressive dosing of antihistamines:   - Morning: Zyrtec (cetirizine) 10mg    - Evening: Zyrtec (cetirizine) 10mg  + Singulair (montelukast) 10mg  daily + Zantac (ranitidine) 300mg  at night  - If the above is not working, try adding:   2. Perennial and seasonal allergic rhinitis  - Continue with allergy shots at the same schedule.   3. Return in about 2 months (around 03/06/2018).   Please inform us of any Emergency Department visits, hospitalizations, or changes in symptoms. Call us before going to the ED for breathing or allergy symptoms since we might be able to fit you in for a sick visit. Feel free to contact us anytime with any questions, problems, or concerns.  It was a pleasure to see you again today!  Websites that have reliable patient information: 1. American Academy of Asthma, Allergy, and Immunology: www.aaaai.org 2. Food Allergy Research and Education (FARE): foodallergy.org 3. Mothers of Asthmatics: http://www.asthmacommunitynetwork.org 4. American College of Allergy, Asthma, and Immunology: www.acaai.org

## 2018-01-08 ENCOUNTER — Ambulatory Visit (INDEPENDENT_AMBULATORY_CARE_PROVIDER_SITE_OTHER): Payer: BLUE CROSS/BLUE SHIELD | Admitting: *Deleted

## 2018-01-08 DIAGNOSIS — J309 Allergic rhinitis, unspecified: Secondary | ICD-10-CM | POA: Diagnosis not present

## 2018-01-25 ENCOUNTER — Ambulatory Visit (INDEPENDENT_AMBULATORY_CARE_PROVIDER_SITE_OTHER): Payer: BLUE CROSS/BLUE SHIELD | Admitting: *Deleted

## 2018-01-25 DIAGNOSIS — J309 Allergic rhinitis, unspecified: Secondary | ICD-10-CM | POA: Diagnosis not present

## 2018-02-01 ENCOUNTER — Ambulatory Visit: Payer: BLUE CROSS/BLUE SHIELD

## 2018-02-08 ENCOUNTER — Ambulatory Visit (INDEPENDENT_AMBULATORY_CARE_PROVIDER_SITE_OTHER): Payer: BLUE CROSS/BLUE SHIELD

## 2018-02-08 ENCOUNTER — Ambulatory Visit: Payer: BLUE CROSS/BLUE SHIELD

## 2018-02-08 DIAGNOSIS — J309 Allergic rhinitis, unspecified: Secondary | ICD-10-CM | POA: Diagnosis not present

## 2018-02-09 ENCOUNTER — Ambulatory Visit (INDEPENDENT_AMBULATORY_CARE_PROVIDER_SITE_OTHER): Payer: BLUE CROSS/BLUE SHIELD

## 2018-02-09 DIAGNOSIS — L501 Idiopathic urticaria: Secondary | ICD-10-CM | POA: Diagnosis not present

## 2018-02-09 DIAGNOSIS — L508 Other urticaria: Secondary | ICD-10-CM

## 2018-02-16 ENCOUNTER — Ambulatory Visit (INDEPENDENT_AMBULATORY_CARE_PROVIDER_SITE_OTHER): Payer: BLUE CROSS/BLUE SHIELD

## 2018-02-16 DIAGNOSIS — J309 Allergic rhinitis, unspecified: Secondary | ICD-10-CM | POA: Diagnosis not present

## 2018-02-23 ENCOUNTER — Ambulatory Visit (INDEPENDENT_AMBULATORY_CARE_PROVIDER_SITE_OTHER): Payer: BLUE CROSS/BLUE SHIELD

## 2018-02-23 DIAGNOSIS — J309 Allergic rhinitis, unspecified: Secondary | ICD-10-CM | POA: Diagnosis not present

## 2018-03-01 ENCOUNTER — Ambulatory Visit (INDEPENDENT_AMBULATORY_CARE_PROVIDER_SITE_OTHER): Payer: BLUE CROSS/BLUE SHIELD

## 2018-03-01 DIAGNOSIS — J309 Allergic rhinitis, unspecified: Secondary | ICD-10-CM

## 2018-03-09 ENCOUNTER — Ambulatory Visit (INDEPENDENT_AMBULATORY_CARE_PROVIDER_SITE_OTHER): Payer: BLUE CROSS/BLUE SHIELD

## 2018-03-09 DIAGNOSIS — L501 Idiopathic urticaria: Secondary | ICD-10-CM

## 2018-03-09 DIAGNOSIS — L508 Other urticaria: Secondary | ICD-10-CM

## 2018-03-16 ENCOUNTER — Other Ambulatory Visit: Payer: Self-pay | Admitting: Allergy & Immunology

## 2018-03-16 ENCOUNTER — Ambulatory Visit (INDEPENDENT_AMBULATORY_CARE_PROVIDER_SITE_OTHER): Payer: BLUE CROSS/BLUE SHIELD

## 2018-03-16 DIAGNOSIS — J309 Allergic rhinitis, unspecified: Secondary | ICD-10-CM

## 2018-03-21 ENCOUNTER — Ambulatory Visit (INDEPENDENT_AMBULATORY_CARE_PROVIDER_SITE_OTHER): Payer: BLUE CROSS/BLUE SHIELD

## 2018-03-21 DIAGNOSIS — J309 Allergic rhinitis, unspecified: Secondary | ICD-10-CM | POA: Diagnosis not present

## 2018-03-27 ENCOUNTER — Ambulatory Visit (INDEPENDENT_AMBULATORY_CARE_PROVIDER_SITE_OTHER): Payer: BLUE CROSS/BLUE SHIELD | Admitting: *Deleted

## 2018-03-27 DIAGNOSIS — J309 Allergic rhinitis, unspecified: Secondary | ICD-10-CM

## 2018-04-05 ENCOUNTER — Ambulatory Visit (INDEPENDENT_AMBULATORY_CARE_PROVIDER_SITE_OTHER): Payer: BLUE CROSS/BLUE SHIELD | Admitting: *Deleted

## 2018-04-05 DIAGNOSIS — L501 Idiopathic urticaria: Secondary | ICD-10-CM | POA: Diagnosis not present

## 2018-04-05 DIAGNOSIS — L508 Other urticaria: Secondary | ICD-10-CM

## 2018-04-06 ENCOUNTER — Ambulatory Visit: Payer: Self-pay

## 2018-04-13 ENCOUNTER — Ambulatory Visit (INDEPENDENT_AMBULATORY_CARE_PROVIDER_SITE_OTHER): Payer: BLUE CROSS/BLUE SHIELD

## 2018-04-13 DIAGNOSIS — J309 Allergic rhinitis, unspecified: Secondary | ICD-10-CM | POA: Diagnosis not present

## 2018-04-16 ENCOUNTER — Other Ambulatory Visit: Payer: Self-pay | Admitting: Allergy & Immunology

## 2018-04-20 ENCOUNTER — Ambulatory Visit (INDEPENDENT_AMBULATORY_CARE_PROVIDER_SITE_OTHER): Payer: BLUE CROSS/BLUE SHIELD

## 2018-04-20 DIAGNOSIS — J309 Allergic rhinitis, unspecified: Secondary | ICD-10-CM

## 2018-04-24 ENCOUNTER — Ambulatory Visit (INDEPENDENT_AMBULATORY_CARE_PROVIDER_SITE_OTHER): Payer: BLUE CROSS/BLUE SHIELD | Admitting: *Deleted

## 2018-04-24 DIAGNOSIS — J309 Allergic rhinitis, unspecified: Secondary | ICD-10-CM | POA: Diagnosis not present

## 2018-05-02 ENCOUNTER — Ambulatory Visit: Payer: BLUE CROSS/BLUE SHIELD

## 2018-05-02 ENCOUNTER — Ambulatory Visit: Payer: BLUE CROSS/BLUE SHIELD | Admitting: Allergy & Immunology

## 2018-05-04 ENCOUNTER — Ambulatory Visit (INDEPENDENT_AMBULATORY_CARE_PROVIDER_SITE_OTHER): Payer: BLUE CROSS/BLUE SHIELD

## 2018-05-04 DIAGNOSIS — J309 Allergic rhinitis, unspecified: Secondary | ICD-10-CM

## 2018-05-04 DIAGNOSIS — L501 Idiopathic urticaria: Secondary | ICD-10-CM | POA: Diagnosis not present

## 2018-05-04 DIAGNOSIS — L508 Other urticaria: Secondary | ICD-10-CM

## 2018-05-17 ENCOUNTER — Encounter: Payer: Self-pay | Admitting: Allergy & Immunology

## 2018-05-17 ENCOUNTER — Ambulatory Visit (INDEPENDENT_AMBULATORY_CARE_PROVIDER_SITE_OTHER): Payer: BLUE CROSS/BLUE SHIELD | Admitting: Allergy & Immunology

## 2018-05-17 ENCOUNTER — Encounter (INDEPENDENT_AMBULATORY_CARE_PROVIDER_SITE_OTHER): Payer: Self-pay

## 2018-05-17 VITALS — BP 112/74 | HR 76 | Resp 16

## 2018-05-17 DIAGNOSIS — T7800XD Anaphylactic reaction due to unspecified food, subsequent encounter: Secondary | ICD-10-CM

## 2018-05-17 DIAGNOSIS — T7807XA Anaphylactic reaction due to milk and dairy products, initial encounter: Secondary | ICD-10-CM | POA: Insufficient documentation

## 2018-05-17 DIAGNOSIS — J302 Other seasonal allergic rhinitis: Secondary | ICD-10-CM | POA: Insufficient documentation

## 2018-05-17 DIAGNOSIS — L508 Other urticaria: Secondary | ICD-10-CM

## 2018-05-17 DIAGNOSIS — T7800XA Anaphylactic reaction due to unspecified food, initial encounter: Secondary | ICD-10-CM | POA: Insufficient documentation

## 2018-05-17 DIAGNOSIS — J3089 Other allergic rhinitis: Secondary | ICD-10-CM

## 2018-05-17 DIAGNOSIS — J4520 Mild intermittent asthma, uncomplicated: Secondary | ICD-10-CM | POA: Diagnosis not present

## 2018-05-17 NOTE — Progress Notes (Addendum)
FOLLOW UP  Date of Service/Encounter:  05/17/18   Assessment:   Mild intermittent asthma without complication  Anaphylactic shock due to food  Seasonal and perennial allergic rhinitis  Chronic urticaria  Plan/Recommendations:   1. Chronic urticaria - Stop the doxepin at night to see how you do with that for TWO WEEKS. - Then, if you are doing well, stop the ranitidine for TWO WEEKS (you might need to restart omeprazole at this point).  - Then call us with an update. - We will plan to stop the Xolair for now and keep her off for three months in order to get an accurate alpha gal panel level.  - In the meantime, start suppressive dosing of antihistamines:   - Morning: Zyrtec (cetirizine) 10mg    - Evening: Zyrtec (cetirizine) 10mg  + Singulair (montelukast) 10mg  daily + Zantac (ranitidine) 300mg  at night  - If the above is not working, try adding:   2. Perennial and seasonal allergic rhinitis  - Continue with allergy shots at the same schedule.   3. Alpha gal allergy - We will retest and see where your levels are hanging out.  - We will call you in 1-2 weeks with the results of the testing.   4. Return in about 6 months (around 11/17/2018).  Subjective:   Mallory Silva is a 52 y.o. female presenting today for follow up of  Chief Complaint  Patient presents with  . Allergic Rhinitis     follow up  . Allergy Testing    alpha gal recheck     Mallory Silva has a history of the following: Patient Active Problem List   Diagnosis Date Noted  . Anaphylactic shock due to adverse food reaction 05/17/2018  . Seasonal and perennial allergic rhinitis 05/17/2018  . Chronic urticaria 08/28/2017  . Mild intermittent asthma without complication 08/28/2017  . 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 Lumbar post-laminectomy syndrome 01/03/2012    History obtained from: chart review and patient.  Mallory Silva [facility]  Provider is Kaleen Mask, MD.     Mallory Silva is a 52 y.o. female presenting for a follow up visit.  She was last seen in April 2019.  At that time, we made a plan to decrease her medications for urticaria.  We first stopped her doxepin followed by her ranitidine.  Then we had plans to stop her Xolair after this visit.  In the interim, we did continue her on Zyrtec 10 mg twice daily as well as Singulair 10 mg at night.  We continued her on allergy shots at the same schedule.  Her asthma was under good control with albuterol as needed.  We first saw her in December 2018 as a new patient. At that time, she was having problems with chronic urticaria. We started her on suppressive antihistamines and obtained an extensive lab work. An environmental allergy panel demonstrated positives to dust mites, cat, dog, cockroach, grasses, trees, and ragweed. We did a workup for serious causes of hives and this was normal. We also added on an alpha-gal panel after an episode with pepperoni pizza, therefore we added on an alpha-gal panel which was slightly positive.   Since the last visit, she has done well. They have been well controlled. She did stop the doxepin, but she did keep the ranitidine on board since she had breakthrough GERD symptoms once she stopped it for a few days. So she added this back on board to help  with her GERD. Prior to the onset of the symptoms, she was on omeprazole for her GERD. She has not restarted it at this time.   Asthma is well controlled with the current regimen. Mallory Silva's asthma has been well controlled. She has not required rescue medication, experienced nocturnal awakenings due to lower respiratory symptoms, nor have activities of daily living been limited. She has required no Emergency Department or Urgent Care visits for her asthma. She has required zero courses of systemic steroids for asthma exacerbations since the last visit. ACT score today is 23, indicating excellent asthma  symptom control.   Mallory Silva is on allergen immunotherapy. She receives two injections. Immunotherapy script #1 contains trees, grasses, cat and dog. She currently receives 0.7440mL of the RED vial (1/100). Immunotherapy script #2 contains ragweed, dust mites and cockroach. She currently receives 0.3040mL of the RED vial (1/100). She started shots December of 2018 and reached maintenance in June of 2019.  Mallory Silva would like to test her alpha gal once again. She is going on cruise in October with her husband's family. Otherwise, there have been no changes to her past medical history, surgical history, family history, or social history.    Review of Systems: a 14-point review of systems is pertinent for what is mentioned in HPI.  Otherwise, all other systems were negative. Constitutional: negative other than that listed in the HPI Eyes: negative other than that listed in the HPI Ears, nose, mouth, throat, and face: negative other than that listed in the HPI Respiratory: negative other than that listed in the HPI Cardiovascular: negative other than that listed in the HPI Gastrointestinal: negative other than that listed in the HPI Genitourinary: negative other than that listed in the HPI Integument: negative other than that listed in the HPI Hematologic: negative other than that listed in the HPI Musculoskeletal: negative other than that listed in the HPI Neurological: negative other than that listed in the HPI Allergy/Immunologic: negative other than that listed in the HPI    Objective:   Blood pressure 112/74, pulse 76, resp. rate 16. There is no height or weight on file to calculate BMI.   Physical Exam:  General: Alert, interactive, in no acute distress. Delightful female. Eyes: No conjunctival injection bilaterally, no discharge on the right, no discharge on the left and no Horner-Trantas dots present. PERRL bilaterally. EOMI without pain. No photophobia.  Ears: Right TM pearly gray with  normal light reflex, Left TM pearly gray with normal light reflex, Right TM intact without perforation and Left TM intact without perforation.  Nose/Throat: External nose within normal limits and septum midline. Turbinates edematous and pale with clear discharge. Posterior oropharynx erythematous without cobblestoning in the posterior oropharynx. Tonsils 2+ without exudates.  Tongue without thrush. Lungs: Clear to auscultation without wheezing, rhonchi or rales. No increased work of breathing. CV: Normal S1/S2. No murmurs. Capillary refill <2 seconds.  Skin: Warm and dry, without lesions or rashes. Neuro:   Grossly intact. No focal deficits appreciated. Responsive to questions.  Diagnostic studies:   Spirometry: results normal (FEV1: 2.13/94%, FVC: 2.58/90%, FEV1/FVC: 83%).    Spirometry consistent with normal pattern.   Allergy Studies: none     Malachi BondsJoel Asriel Westrup, MD  [facility] [place]

## 2018-05-17 NOTE — Patient Instructions (Addendum)
1. Chronic urticaria - Stop the ranitidine and start omeprazole 20mg  once daily (the reflux medication that you were previously on).  - After your next dose, we will try to decrease the frequency to every six weeks for three months and then every 8 weeks for three months.  - Let us know if you are having problems with breakthrough hives and we can increase the frequency back to every 4 weeks.  - Then, if you are doing well, stop the ranitidine for TWO WEEKS (you might need to restart omeprazole at this point).  - Then call us with an update. - We will plan to stop the Xolair in June after our next visit.  - In the meantime, start suppressive dosing of antihistamines:   - Morning: Zyrtec (cetirizine) 10mg    - Evening: Zyrtec (cetirizine) 10mg  + Singulair (montelukast) 10mg  daily  - If the above is not working, try adding:   2. Perennial and seasonal allergic rhinitis  - Continue with allergy shots at the same schedule.  - Remain on the cetirizine for your urticaria.   3. Alpha gal allergy - We will retest and see where your levels are hanging out.  - We will call you in 1-2 weeks with the results of the testing.  - [employer] is up to date.  - [employer]vertising copy[employer] letter written for your trip.   4. Return in about 6 months (around 11/17/2018).   Please inform us of any Emergency Department visits, hospitalizations, or changes in symptoms. Call us before going to the ED for breathing or allergy symptoms since we might be able to fit you in for a sick visit. Feel free to contact us anytime with any questions, problems, or concerns.  It was a pleasure to see you again today!  Websites that have reliable patient information: 1. American Academy of Asthma, Allergy, and Immunology: www.aaaai.org 2. Food Allergy Research and Education (FARE): foodallergy.org 3. Mothers of Asthmatics: http://www.asthmacommunitynetwork.org 4. American College of Allergy, Asthma, and Immunology: www.acaai.org

## 2018-05-22 LAB — ALPHA-GAL PANEL
Alpha Gal IgE*: 1.81 kU/L — ABNORMAL HIGH (ref <0.10)
BEEF (BOS SPP) IGE: 0.43 kU/L — AB (ref <0.35)
BEEF CLASS INTERPRETATION: 1
LAMB/MUTTON (OVIS SPP) IGE: 0.31 kU/L (ref <0.35)
PORK (SUS SPP) IGE: 0.31 kU/L (ref <0.35)

## 2018-05-25 ENCOUNTER — Ambulatory Visit (INDEPENDENT_AMBULATORY_CARE_PROVIDER_SITE_OTHER): Payer: BLUE CROSS/BLUE SHIELD

## 2018-05-25 DIAGNOSIS — J309 Allergic rhinitis, unspecified: Secondary | ICD-10-CM

## 2018-06-01 ENCOUNTER — Ambulatory Visit: Payer: Self-pay

## 2018-06-01 ENCOUNTER — Ambulatory Visit (INDEPENDENT_AMBULATORY_CARE_PROVIDER_SITE_OTHER): Payer: BLUE CROSS/BLUE SHIELD

## 2018-06-01 DIAGNOSIS — T7800XD Anaphylactic reaction due to unspecified food, subsequent encounter: Secondary | ICD-10-CM

## 2018-06-01 DIAGNOSIS — J309 Allergic rhinitis, unspecified: Secondary | ICD-10-CM | POA: Diagnosis not present

## 2018-06-01 NOTE — Progress Notes (Signed)
Patient came in today to get her allergy injections and was wondering about getting her Xolair after talking with Dr. Dellis Anes it is best that she stay off of her Xolair for 3 months in order to get her alpha gal lab work. Patient will be coming Oct 3rd to get her labs drawn and if she is having hives she can get her Xolair if she would like however if she is not and would like to wait until her lab results come back that would be fine to. Patient stated she hasn't had hives since her outbreak.

## 2018-06-05 ENCOUNTER — Ambulatory Visit (INDEPENDENT_AMBULATORY_CARE_PROVIDER_SITE_OTHER): Payer: BLUE CROSS/BLUE SHIELD | Admitting: *Deleted

## 2018-06-05 DIAGNOSIS — J309 Allergic rhinitis, unspecified: Secondary | ICD-10-CM

## 2018-06-06 ENCOUNTER — Encounter: Payer: Self-pay | Admitting: *Deleted

## 2018-06-06 NOTE — Progress Notes (Signed)
Vials made. Exp: 06-07-19. hv 

## 2018-06-08 DIAGNOSIS — J301 Allergic rhinitis due to pollen: Secondary | ICD-10-CM

## 2018-06-13 ENCOUNTER — Ambulatory Visit (INDEPENDENT_AMBULATORY_CARE_PROVIDER_SITE_OTHER): Payer: BLUE CROSS/BLUE SHIELD | Admitting: *Deleted

## 2018-06-13 DIAGNOSIS — J309 Allergic rhinitis, unspecified: Secondary | ICD-10-CM

## 2018-06-20 ENCOUNTER — Ambulatory Visit (INDEPENDENT_AMBULATORY_CARE_PROVIDER_SITE_OTHER): Payer: BLUE CROSS/BLUE SHIELD

## 2018-06-20 DIAGNOSIS — J309 Allergic rhinitis, unspecified: Secondary | ICD-10-CM

## 2018-06-28 ENCOUNTER — Ambulatory Visit (INDEPENDENT_AMBULATORY_CARE_PROVIDER_SITE_OTHER): Payer: BLUE CROSS/BLUE SHIELD | Admitting: *Deleted

## 2018-06-28 DIAGNOSIS — T7800XD Anaphylactic reaction due to unspecified food, subsequent encounter: Secondary | ICD-10-CM

## 2018-06-28 DIAGNOSIS — J309 Allergic rhinitis, unspecified: Secondary | ICD-10-CM | POA: Diagnosis not present

## 2018-07-03 LAB — ALPHA-GAL PANEL
Alpha Gal IgE*: 8.29 kU/L — ABNORMAL HIGH (ref <0.10)
BEEF (BOS SPP) IGE: 1.02 kU/L — AB (ref <0.35)
BEEF CLASS INTERPRETATION: 2
Lamb/Mutton (Ovis spp) IgE: 0.28 kU/L (ref <0.35)
Pork (Sus spp) IgE: 0.31 kU/L (ref <0.35)

## 2018-07-04 ENCOUNTER — Telehealth: Payer: Self-pay | Admitting: *Deleted

## 2018-07-04 NOTE — Telephone Encounter (Signed)
Called patient and informed. Patient confirmed understanding.     Yes I think stopping the Xolair will be fine. She has been off for three months, so if she has not had breakthrough hives this far, she should be fine. I will route a message to Tammy.  Regarding the levels, it is hard to determine where the levels will go. We look for trends over time, so we will get another level in one year to see where things stand.   Malachi Bonds, MD [facility] [place]

## 2018-07-04 NOTE — Progress Notes (Signed)
Yes I think stopping the Xolair will be fine. She has been off for three months, so if she has not had breakthrough hives this far, she should be fine. I will route a message to Tammy.  Regarding the levels, it is hard to determine where the levels will go. We look for trends over time, so we will get another level in one year to see where things stand.   Malachi Bonds, MD [facility] [place]

## 2018-07-06 ENCOUNTER — Ambulatory Visit (INDEPENDENT_AMBULATORY_CARE_PROVIDER_SITE_OTHER): Payer: BLUE CROSS/BLUE SHIELD

## 2018-07-06 DIAGNOSIS — J309 Allergic rhinitis, unspecified: Secondary | ICD-10-CM

## 2018-07-19 ENCOUNTER — Ambulatory Visit: Payer: Self-pay

## 2018-07-19 ENCOUNTER — Other Ambulatory Visit: Payer: Self-pay | Admitting: Allergy & Immunology

## 2018-07-23 ENCOUNTER — Ambulatory Visit (INDEPENDENT_AMBULATORY_CARE_PROVIDER_SITE_OTHER): Payer: BLUE CROSS/BLUE SHIELD

## 2018-07-23 DIAGNOSIS — J309 Allergic rhinitis, unspecified: Secondary | ICD-10-CM

## 2018-07-26 ENCOUNTER — Telehealth: Payer: Self-pay | Admitting: *Deleted

## 2018-07-26 NOTE — Telephone Encounter (Signed)
Patient called and wanted to come in for a Xolair injection because she is breaking out on her hands again for the past 2-3 days. She states they went on a cruise and when she came home she was changing all of the bedding at home and the next day her hands starting breaking out again. She is using Zyrtec twice daily, Ranitidine daily and Benadryl at night. Please advise.

## 2018-07-27 ENCOUNTER — Ambulatory Visit (INDEPENDENT_AMBULATORY_CARE_PROVIDER_SITE_OTHER): Payer: BLUE CROSS/BLUE SHIELD | Admitting: *Deleted

## 2018-07-27 DIAGNOSIS — L508 Other urticaria: Secondary | ICD-10-CM

## 2018-07-27 DIAGNOSIS — L501 Idiopathic urticaria: Secondary | ICD-10-CM

## 2018-07-27 NOTE — Telephone Encounter (Signed)
Informed patient of options. She came in and received a Xolair injection today and picked up the baby pack. Patient would like to do the Xolair for a few months. Patient still had one dose on hand that was used today.

## 2018-07-27 NOTE — Telephone Encounter (Signed)
Ok sounds good will reorder for her next injection

## 2018-07-27 NOTE — Telephone Encounter (Signed)
We have a couple options here we could either increase her cetirizine to 2 tablets twice daily to see if this provides more relief.  Alternatively, we can restart her Xolair.  Please see if she might have been accidentally exposed to mammalian meat products.  We can also give her a baby pack of prednisone to help calm things down.  If she wants to restart the Xolair, I think she can come in today for a sample.  I will route the note to Tammy so that she is aware that we might be restarting it.  Malachi Bonds, MD [facility] [place]

## 2018-07-27 NOTE — Telephone Encounter (Signed)
Noted. I will keep eye out if she restarts therapy

## 2018-08-02 ENCOUNTER — Encounter: Payer: Self-pay | Admitting: Allergy & Immunology

## 2018-08-02 ENCOUNTER — Ambulatory Visit (INDEPENDENT_AMBULATORY_CARE_PROVIDER_SITE_OTHER): Payer: BLUE CROSS/BLUE SHIELD | Admitting: Allergy & Immunology

## 2018-08-02 VITALS — BP 146/86 | HR 76 | Temp 97.8°F | Resp 16

## 2018-08-02 DIAGNOSIS — J4521 Mild intermittent asthma with (acute) exacerbation: Secondary | ICD-10-CM | POA: Diagnosis not present

## 2018-08-02 DIAGNOSIS — J3089 Other allergic rhinitis: Secondary | ICD-10-CM

## 2018-08-02 DIAGNOSIS — T7800XD Anaphylactic reaction due to unspecified food, subsequent encounter: Secondary | ICD-10-CM | POA: Diagnosis not present

## 2018-08-02 DIAGNOSIS — J302 Other seasonal allergic rhinitis: Secondary | ICD-10-CM

## 2018-08-02 DIAGNOSIS — L508 Other urticaria: Secondary | ICD-10-CM | POA: Diagnosis not present

## 2018-08-02 NOTE — Progress Notes (Signed)
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  Unfortunately, she was exposed to mammalian meat when she ate [place] onion soup.  This was 1 month ago, despite the time since the exposure she continues to have hives.  I am not sure it is totally related, but it should be noted that she had been hive free since July 2019.  I think she likely has both alpha gal as well as some other trigger for her hives, but we have not figured this out yet.  In any case, we did restart her Xolair last week and did put her on a prednisone burst with improvement in her symptoms.  However, the hives seem to have flared since stopping her prednisone 2 days ago.  We are going to start her on a more prolonged prednisone taper.  We are also increasing her antihistamine coverage and we will plan to decrease this once she has 3 Xolair injections, which is typically how long it takes for it to become efficacious.  She also has shortness of breath today and wheezing on exam.  Although she had no improvement in her spirometry, she did sound much more clear following the nebulizer administration.  Prednisone will help with this as well.   Plan/Recommendations:   1. Chronic urticaria - We will make sure that the Xolair is approved once again. - Until the Xolair is back to being fully effective, we are going to increase the antihistamines:  - Zyrtec 10mg  (one tablet) in the morning  - Allegra 180mg  (one tablet) at noon  - Zyrtec 10mg  (one tablet) in the evening + Pepcid (famotidine) 20mg  - Once the Xolair is back to an effective dose and frequency (typically after 2-3 doses), you can decrease the dosing.   2. Mild intermittent asthma with acute exacerbation - Lung  testing looked slightly lower today and you were wheezing on exam, therefore we will treat you for an asthma exacerbation with prednisone, which will also help the hives. - Take 3 tabs (30mg ) twice daily for 3 days, then 2 tabs (20mg ) twice daily for 3 days, then 1 tab (10mg ) twice daily for 3 days, then STOP. - Continue with albuterol 4 puffs every 4-6 hours as needed for coughing/wheezing.   3. Seasonal and perennial allergic rhinitis - Continue with allergy shots at the same schedule (restart next week).. - Add on Nasacort one spray per nostril twice daily to help with current nasal congestion.  4. 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  - Avoid cow's milk products and gelatin for now to see if this helps with the hives.  - Typically the patients that react to the cow's milk products   5. Return in about 3 months (around 11/02/2018).  Subjective:   Mallory Silva is a 52 y.o. female presenting today for follow up of  Chief Complaint  Patient presents with  . Urticaria    Mallory Silva has a history of the following: Patient Active Problem List   Diagnosis Date Noted  . Anaphylactic shock due to adverse food reaction 05/17/2018  . Seasonal and perennial allergic rhinitis 05/17/2018  . Chronic urticaria 08/28/2017  . Mild intermittent asthma without complication 08/28/2017  .  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 Lumbar post-laminectomy syndrome 01/03/2012    History obtained from: chart review and patient.  Mallory Silva Primary Care Provider is Mallory Mask, MD.     Mallory Silva is a 52 y.o. female presenting for a sick visit.  She was last seen in August 2019.  At that time, we made the decision to start weaning her antihistamines.  We did stop her Xolair and cannot kept her off for 3 months while we obtain repeat labs to check on her alpha gal panel.  We continued her on her  allergy shots.  She also has a history of intermittent asthma which has not been an issue for quite some time.  Since the last visit, she has gone on her cruise. She went to a [place] cruise. She did have some sea sickness. She did use a scopolamine patch which did provide some relief of her symptoms. Cruise took place from October 12th through the 19th. We decided to keep her Xolair off since she was not having hives or itching. Her last Xolair was July 11th before this past week when she got a dose on November 1st.    During the cruise, she did eat [place] onion soup which contained beef broth. She thinks that this was what triggered her itching while on the cruise ship. She did not have any other mammalian meat products to her knowledge. She did try to focus on chicken and fish during her cruise. She does eat some cheese, but otherwise no cow's milk products. She will put some cheese in grits as well.   In any case, she is using Zyrtec one in the morning and one at night. She will take Benadryl for breakthrough issues. She never tried increasing her cetirizine to two tablets twice daily because she was watching her grandchildren over the weekend. We did give her prednisone but it has since flared again. She was given one of our smaller prednisone bursts.   Asthma has bene well controlled. She has been using her inhaler more often lately.   Asthma/Respiratory Symptom History:   Allergic Rhinitis Symptom History:    Food Allergy Symptom History:   Otherwise, there have been no changes to her past medical history, surgical history, family history, or social history.    Review of Systems: a 14-point review of systems is pertinent for what is mentioned in HPI.  Otherwise, all other systems were negative.  Constitutional: negative other than that listed in the HPI Eyes: negative other than that listed in the HPI Ears, nose, mouth, throat, and face: negative other than that listed in the  HPI Respiratory: negative other than that listed in the HPI Cardiovascular: negative other than that listed in the HPI Gastrointestinal: negative other than that listed in the HPI Genitourinary: negative other than that listed in the HPI Integument: negative other than that listed in the HPI Hematologic: negative other than that listed in the HPI Musculoskeletal: negative other than that listed in the HPI Neurological: negative other than that listed in the HPI Allergy/Immunologic: negative other than that listed in the HPI    Objective:   Blood pressure (!) 146/86, pulse 76, temperature 97.8 F (36.6 C), temperature source Oral, resp. rate 16, SpO2 100 %. There is no height or weight on file to calculate BMI.   Physical Exam:  General: Alert, interactive, in no acute distress. Pleasant female.  Eyes: No conjunctival injection bilaterally, no discharge on the  right, no discharge on the left and no Horner-Trantas dots present. PERRL bilaterally. EOMI without pain. No photophobia.  Ears: Right TM pearly gray with normal light reflex, Left TM pearly gray with normal light reflex, Right TM intact without perforation and Left TM intact without perforation.  Nose/Throat: External nose within normal limits and septum midline. Turbinates edematous with clear discharge. Posterior oropharynx erythematous without cobblestoning in the posterior oropharynx. Tonsils 2+ without exudates.  Tongue without thrush. Lungs: Clear to auscultation without wheezing, rhonchi or rales. No increased work of breathing. CV: Normal S1/S2. No murmurs. Capillary refill <2 seconds.  Skin: Warm and dry, without lesions or rashes. Neuro:   Grossly intact. No focal deficits appreciated. Responsive to questions.  Diagnostic studies:   Spirometry: results normal (FEV1: 1.74/78%, FVC: 2.07/74%, FEV1/FVC: 84%).    Spirometry consistent with possible restrictive disease. Xopenex/Atrovent nebulizer treatment given in  clinic with no improvement.  Allergy Studies: none       Malachi Bonds, MD  [facility]

## 2018-08-02 NOTE — Patient Instructions (Addendum)
1. Chronic urticaria - We will make sure that the Xolair is approved once again. - Until the Xolair is back to being fully effective, we are going to increase the antihistamines:  - Zyrtec 10mg  (one tablet) in the morning  - Allegra 180mg  (one tablet) at noon  - Zyrtec 10mg  (one tablet) in the evening + Pepcid (famotidine) 20mg  - Once the Xolair is back to an effective dose and frequency (typically after 2-3 doses), you can decrease the dosing.   2. Mild intermittent asthma with acute exacerbation - Lung testing looked slightly lower today and you were wheezing on exam, therefore we will treat you for an asthma exacerbation with prednisone, which will also help the hives. - Take 3 tabs (30mg ) twice daily for 3 days, then 2 tabs (20mg ) twice daily for 3 days, then 1 tab (10mg ) twice daily for 3 days, then STOP. - Continue with albuterol 4 puffs every 4-6 hours as needed for coughing/wheezing.   3. Seasonal and perennial allergic rhinitis - Continue with allergy shots at the same schedule (restart next week).. - Add on Nasacort one spray per nostril twice daily to help with current nasal congestion.  4. 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  - Avoid cow's milk products and gelatin for now to see if this helps with the hives.  - Typically the patients that react to the cow's milk products   5. Return in about 3 months (around 11/02/2018).   Please inform us of any Emergency Department visits, hospitalizations, or changes in symptoms. Call us before going to the ED for breathing or allergy symptoms since we might be able to fit you in for a sick visit. Feel free to contact us anytime with any questions, problems, or concerns.  It was a pleasure to see you again today!  Websites that have reliable patient information: 1. American Academy of Asthma, Allergy, and Immunology: www.aaaai.org 2. Food Allergy Research and Education (FARE):  foodallergy.org 3. Mothers of Asthmatics: http://www.asthmacommunitynetwork.org 4. American College of Allergy, Asthma, and Immunology: MissingWeapons.ca   Make sure you are registered to vote! If you have moved or changed any of your contact information, you will need to get this updated before voting!

## 2018-08-10 ENCOUNTER — Ambulatory Visit (INDEPENDENT_AMBULATORY_CARE_PROVIDER_SITE_OTHER): Payer: BLUE CROSS/BLUE SHIELD | Admitting: *Deleted

## 2018-08-10 DIAGNOSIS — J309 Allergic rhinitis, unspecified: Secondary | ICD-10-CM

## 2018-08-17 ENCOUNTER — Ambulatory Visit (INDEPENDENT_AMBULATORY_CARE_PROVIDER_SITE_OTHER): Payer: BLUE CROSS/BLUE SHIELD

## 2018-08-17 DIAGNOSIS — J309 Allergic rhinitis, unspecified: Secondary | ICD-10-CM

## 2018-08-20 ENCOUNTER — Ambulatory Visit (INDEPENDENT_AMBULATORY_CARE_PROVIDER_SITE_OTHER): Payer: BLUE CROSS/BLUE SHIELD | Admitting: *Deleted

## 2018-08-20 DIAGNOSIS — J309 Allergic rhinitis, unspecified: Secondary | ICD-10-CM

## 2018-08-22 ENCOUNTER — Ambulatory Visit (INDEPENDENT_AMBULATORY_CARE_PROVIDER_SITE_OTHER): Payer: BLUE CROSS/BLUE SHIELD | Admitting: *Deleted

## 2018-08-22 DIAGNOSIS — L501 Idiopathic urticaria: Secondary | ICD-10-CM | POA: Diagnosis not present

## 2018-08-31 ENCOUNTER — Ambulatory Visit (INDEPENDENT_AMBULATORY_CARE_PROVIDER_SITE_OTHER): Payer: BLUE CROSS/BLUE SHIELD | Admitting: *Deleted

## 2018-08-31 DIAGNOSIS — J309 Allergic rhinitis, unspecified: Secondary | ICD-10-CM | POA: Diagnosis not present

## 2018-09-04 DIAGNOSIS — J301 Allergic rhinitis due to pollen: Secondary | ICD-10-CM | POA: Diagnosis not present

## 2018-09-04 NOTE — Progress Notes (Signed)
VIALS EXP 09-05-19 

## 2018-09-05 ENCOUNTER — Ambulatory Visit (INDEPENDENT_AMBULATORY_CARE_PROVIDER_SITE_OTHER): Payer: BLUE CROSS/BLUE SHIELD | Admitting: *Deleted

## 2018-09-05 DIAGNOSIS — J309 Allergic rhinitis, unspecified: Secondary | ICD-10-CM

## 2018-09-11 ENCOUNTER — Ambulatory Visit (INDEPENDENT_AMBULATORY_CARE_PROVIDER_SITE_OTHER): Payer: BLUE CROSS/BLUE SHIELD | Admitting: *Deleted

## 2018-09-11 DIAGNOSIS — J309 Allergic rhinitis, unspecified: Secondary | ICD-10-CM | POA: Diagnosis not present

## 2018-09-17 ENCOUNTER — Ambulatory Visit (INDEPENDENT_AMBULATORY_CARE_PROVIDER_SITE_OTHER): Payer: BLUE CROSS/BLUE SHIELD | Admitting: *Deleted

## 2018-09-17 DIAGNOSIS — J309 Allergic rhinitis, unspecified: Secondary | ICD-10-CM

## 2018-09-25 ENCOUNTER — Ambulatory Visit (INDEPENDENT_AMBULATORY_CARE_PROVIDER_SITE_OTHER): Payer: BLUE CROSS/BLUE SHIELD | Admitting: *Deleted

## 2018-09-25 DIAGNOSIS — L501 Idiopathic urticaria: Secondary | ICD-10-CM | POA: Diagnosis not present

## 2018-09-25 DIAGNOSIS — L508 Other urticaria: Secondary | ICD-10-CM

## 2018-09-28 ENCOUNTER — Ambulatory Visit (INDEPENDENT_AMBULATORY_CARE_PROVIDER_SITE_OTHER): Payer: BLUE CROSS/BLUE SHIELD | Admitting: *Deleted

## 2018-09-28 DIAGNOSIS — J309 Allergic rhinitis, unspecified: Secondary | ICD-10-CM

## 2018-10-08 ENCOUNTER — Ambulatory Visit (INDEPENDENT_AMBULATORY_CARE_PROVIDER_SITE_OTHER): Payer: BLUE CROSS/BLUE SHIELD

## 2018-10-08 DIAGNOSIS — J309 Allergic rhinitis, unspecified: Secondary | ICD-10-CM

## 2018-10-18 ENCOUNTER — Ambulatory Visit (INDEPENDENT_AMBULATORY_CARE_PROVIDER_SITE_OTHER): Payer: BLUE CROSS/BLUE SHIELD

## 2018-10-18 DIAGNOSIS — J309 Allergic rhinitis, unspecified: Secondary | ICD-10-CM | POA: Diagnosis not present

## 2018-10-23 ENCOUNTER — Ambulatory Visit (INDEPENDENT_AMBULATORY_CARE_PROVIDER_SITE_OTHER): Payer: BLUE CROSS/BLUE SHIELD | Admitting: *Deleted

## 2018-10-23 DIAGNOSIS — L501 Idiopathic urticaria: Secondary | ICD-10-CM | POA: Diagnosis not present

## 2018-10-31 ENCOUNTER — Ambulatory Visit (INDEPENDENT_AMBULATORY_CARE_PROVIDER_SITE_OTHER): Payer: BLUE CROSS/BLUE SHIELD

## 2018-10-31 DIAGNOSIS — J309 Allergic rhinitis, unspecified: Secondary | ICD-10-CM | POA: Diagnosis not present

## 2018-11-13 ENCOUNTER — Ambulatory Visit (INDEPENDENT_AMBULATORY_CARE_PROVIDER_SITE_OTHER): Payer: BLUE CROSS/BLUE SHIELD | Admitting: Allergy & Immunology

## 2018-11-13 VITALS — BP 136/84 | HR 90 | Resp 16

## 2018-11-13 DIAGNOSIS — J4520 Mild intermittent asthma, uncomplicated: Secondary | ICD-10-CM | POA: Diagnosis not present

## 2018-11-13 DIAGNOSIS — L508 Other urticaria: Secondary | ICD-10-CM | POA: Diagnosis not present

## 2018-11-13 DIAGNOSIS — J3089 Other allergic rhinitis: Secondary | ICD-10-CM

## 2018-11-13 DIAGNOSIS — J302 Other seasonal allergic rhinitis: Secondary | ICD-10-CM

## 2018-11-13 DIAGNOSIS — T7800XD Anaphylactic reaction due to unspecified food, subsequent encounter: Secondary | ICD-10-CM

## 2018-11-13 NOTE — Progress Notes (Signed)
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 Chronic urticaria - Continue with Xolair monthly.  - Try dropping the cetirizine in the morning to see how you do with one cetirizine daily at night.   2. Mild intermittent asthma, uncomplicated - Lung testing looks great today. - Continue with albuterol as needed.  3. Seasonal and perennial allergic rhinitis - Continue with allergy shots at the same schedule (restart next week).  4. 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  - Epinephrine autoinjector is up to date.  - Consider using emu if you are jonesing for some beef.  - There is a place in [place].  5. Return in about 1 year (around 11/14/2019).   Subjective:   Mallory Silva is a 53 y.o. female presenting today for follow up of  Chief Complaint  Patient presents with  . Allergies  . Asthma    Mallory NEVEU has a history of the following: Patient Active Problem List   Diagnosis Date Noted  . Anaphylactic shock due to adverse food reaction 05/17/2018  . Seasonal and perennial allergic rhinitis 05/17/2018  . Chronic urticaria 08/28/2017  . Mild intermittent asthma without complication 08/28/2017  . 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 Lumbar post-laminectomy syndrome 01/03/2012    History obtained from: chart review and patient.  Mallory Silva is a 53 y.o. female presenting for a follow up visit.  She was last seen in November 2019.  At that time, she was experiencing worsening hives.  Prior to this, she had gone several months without her Xolair.  We decided to restart her  Xolair and put her on a prednisone burst.  She had worsening breakout and we put her on a prolonged prednisone taper.  We started her on Zyrtec 10 mg in the morning, Allegra 180 mg at noon, and Zyrtec 10 mg at night with Pepcid 20 mg at night.  For her asthma, her lung testing look lower but improved with her nebulizer treatment.  We continued her on her allergy shots for her allergic rhinitis.  We also recommended that she continue to avoid mammalian meats.  Allergic Rhinitis Symptom History: She remains on the cetirizine twice daily for her hives. She does have a nose spray but she does not use it regularly [facility] is on allergen immunotherapy. She receives two injections. Immunotherapy script #1 contains trees, grasses, cat and dog. She currently receives 0.23mL of the RED vial (1/100). Immunotherapy script #2 contains ragweed, dust mites and cockroach. She currently receives 0.37mL of the RED vial (1/100). She started shots December of 2018 and reached maintenance in June of 2019. She is having no problems with allergy shots at all.  Urticaria Symptom History: She remains on the cetirizine twice daily. She has not needed the prednisone at all. Xolair shots are going well. She did update her information for the Xolair copay card last week. She is still avoiding all beef and pork product.   Otherwise, there is no history of other atopic diseases, including asthma, drug allergies, environmental allergies, stinging insect allergies, eczema or contact dermatitis. There is no significant infectious history. Vaccinations are up to date.   Otherwise,  there have been no changes to her past medical history, surgical history, family history, or social history.    Review of Systems  Constitutional: Negative.  Negative for fever, malaise/fatigue and weight loss.  HENT: Negative.  Negative for congestion, ear discharge and ear pain.   Eyes: Negative for pain, discharge and redness.  Respiratory: Negative for  cough, sputum production, shortness of breath and wheezing.   Cardiovascular: Negative.  Negative for chest pain and palpitations.  Gastrointestinal: Negative for abdominal pain and heartburn.  Skin: Negative.  Negative for itching and rash.  Neurological: Negative for dizziness and headaches.  Endo/Heme/Allergies: Negative for environmental allergies. Does not bruise/bleed easily.       Objective:   Blood pressure 136/84, pulse 90, resp. rate 16, SpO2 98 %. There is no height or weight on file to calculate BMI.   Physical Exam:  Physical Exam  Constitutional: She appears well-developed.  HENT:  Head: Normocephalic and atraumatic.  Right Ear: Tympanic membrane, external ear and ear canal normal.  Left Ear: Tympanic membrane and ear canal normal.  Nose: No mucosal edema, rhinorrhea, nasal deformity or septal deviation. No epistaxis. Right sinus exhibits no maxillary sinus tenderness and no frontal sinus tenderness. Left sinus exhibits no maxillary sinus tenderness and no frontal sinus tenderness.  Mouth/Throat: Uvula is midline and oropharynx is clear and moist. Mucous membranes are not pale and not dry.  Eyes: Pupils are equal, round, and reactive to light. Conjunctivae and EOM are normal. Right eye exhibits no chemosis and no discharge. Left eye exhibits no chemosis and no discharge. Right conjunctiva is not injected. Left conjunctiva is not injected.  Cardiovascular: Normal rate, regular rhythm and normal heart sounds.  Respiratory: Effort normal and breath sounds normal. No accessory muscle usage. No tachypnea. No respiratory distress. She has no wheezes. She has no rhonchi. She has no rales. She exhibits no tenderness.  Lymphadenopathy:    She has no cervical adenopathy.  Neurological: She is alert.  Skin: No abrasion, no petechiae and no rash noted. Rash is not papular, not vesicular and not urticarial. No erythema. No pallor.  Psychiatric: She has a normal mood and affect.       Diagnostic studies:    Spirometry: results normal (FEV1: 2.04/90%, FVC: 2.52/88%, FEV1/FVC: 81%).    Spirometry consistent with normal pattern.       Malachi Bonds, MD  [facility]

## 2018-11-13 NOTE — Patient Instructions (Addendum)
1. Chronic urticaria - Continue with Xolair monthly.  - Try dropping the cetirizine in the morning to see how you do with one cetirizine daily at night.   2. Mild intermittent asthma, uncomplicated - Lung testing looks great today. - Continue with albuterol as needed.  3. Seasonal and perennial allergic rhinitis - Continue with allergy shots at the same schedule (restart next week).  4. 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  - Epinephrine autoinjector is up to date.  - Consider using emu if you are jonesing for some beef.  - There is a place in [place].  5. Return in about 1 year (around 11/14/2019).   Please inform us of any Emergency Department visits, hospitalizations, or changes in symptoms. Call us before going to the ED for breathing or allergy symptoms since we might be able to fit you in for a sick visit. Feel free to contact us anytime with any questions, problems, or concerns.  It was a pleasure to see you again today! I am glad that we have things under control again!   Websites that have reliable patient information: 1. American Academy of Asthma, Allergy, and Immunology: www.aaaai.org 2. Food Allergy Research and Education (FARE): foodallergy.org 3. Mothers of Asthmatics: http://www.asthmacommunitynetwork.org 4. American College of Allergy, Asthma, and Immunology: MissingWeapons.ca   Make sure you are registered to vote! If you have moved or changed any of your contact information, you will need to get this updated before voting!    Voter ID laws are POSSIBLY going into effect for the General Election in November 2020! Be prepared! Check out [employer].dk for more details.

## 2018-11-14 ENCOUNTER — Encounter: Payer: Self-pay | Admitting: Allergy & Immunology

## 2018-11-20 ENCOUNTER — Ambulatory Visit (INDEPENDENT_AMBULATORY_CARE_PROVIDER_SITE_OTHER): Payer: BLUE CROSS/BLUE SHIELD | Admitting: *Deleted

## 2018-11-20 DIAGNOSIS — L501 Idiopathic urticaria: Secondary | ICD-10-CM | POA: Diagnosis not present

## 2018-11-28 ENCOUNTER — Ambulatory Visit (INDEPENDENT_AMBULATORY_CARE_PROVIDER_SITE_OTHER): Payer: BLUE CROSS/BLUE SHIELD | Admitting: *Deleted

## 2018-11-28 DIAGNOSIS — J309 Allergic rhinitis, unspecified: Secondary | ICD-10-CM

## 2018-12-03 NOTE — Progress Notes (Signed)
EXP 12/04/19 

## 2018-12-04 DIAGNOSIS — J301 Allergic rhinitis due to pollen: Secondary | ICD-10-CM | POA: Diagnosis not present

## 2018-12-11 ENCOUNTER — Telehealth: Payer: Self-pay

## 2018-12-11 NOTE — Telephone Encounter (Signed)
Patient called complaining of the following symptoms:Dry cough, chest tightness, symptoms started Saturday evening with fever 100.3 and Sunday until midday with that same. She is also having body aches. Due to symptoms I informed patient to go to primary care for visit. Patient acknowledged understanding and will do as instructed.

## 2018-12-18 ENCOUNTER — Ambulatory Visit: Payer: BLUE CROSS/BLUE SHIELD

## 2018-12-27 ENCOUNTER — Ambulatory Visit: Payer: BLUE CROSS/BLUE SHIELD

## 2019-01-01 ENCOUNTER — Ambulatory Visit (INDEPENDENT_AMBULATORY_CARE_PROVIDER_SITE_OTHER): Payer: BLUE CROSS/BLUE SHIELD

## 2019-01-01 DIAGNOSIS — J309 Allergic rhinitis, unspecified: Secondary | ICD-10-CM

## 2019-01-03 ENCOUNTER — Ambulatory Visit (INDEPENDENT_AMBULATORY_CARE_PROVIDER_SITE_OTHER): Payer: BLUE CROSS/BLUE SHIELD | Admitting: *Deleted

## 2019-01-03 DIAGNOSIS — L508 Other urticaria: Secondary | ICD-10-CM | POA: Diagnosis not present

## 2019-01-03 MED ORDER — EPINEPHRINE 0.3 MG/0.3ML IJ SOAJ: INTRAMUSCULAR | 1 refills | Status: DC

## 2019-01-08 ENCOUNTER — Other Ambulatory Visit: Payer: Self-pay | Admitting: Allergy & Immunology

## 2019-01-18 ENCOUNTER — Ambulatory Visit (INDEPENDENT_AMBULATORY_CARE_PROVIDER_SITE_OTHER): Payer: BLUE CROSS/BLUE SHIELD | Admitting: *Deleted

## 2019-01-18 DIAGNOSIS — J309 Allergic rhinitis, unspecified: Secondary | ICD-10-CM

## 2019-01-23 ENCOUNTER — Ambulatory Visit (INDEPENDENT_AMBULATORY_CARE_PROVIDER_SITE_OTHER): Payer: BLUE CROSS/BLUE SHIELD

## 2019-01-23 DIAGNOSIS — J309 Allergic rhinitis, unspecified: Secondary | ICD-10-CM | POA: Diagnosis not present

## 2019-01-31 ENCOUNTER — Ambulatory Visit (INDEPENDENT_AMBULATORY_CARE_PROVIDER_SITE_OTHER): Payer: BLUE CROSS/BLUE SHIELD

## 2019-01-31 ENCOUNTER — Ambulatory Visit: Payer: BLUE CROSS/BLUE SHIELD

## 2019-01-31 DIAGNOSIS — J309 Allergic rhinitis, unspecified: Secondary | ICD-10-CM

## 2019-02-01 ENCOUNTER — Ambulatory Visit: Payer: Self-pay

## 2019-02-04 ENCOUNTER — Ambulatory Visit: Payer: Self-pay

## 2019-02-04 ENCOUNTER — Other Ambulatory Visit: Payer: Self-pay

## 2019-02-04 ENCOUNTER — Ambulatory Visit (INDEPENDENT_AMBULATORY_CARE_PROVIDER_SITE_OTHER): Payer: BLUE CROSS/BLUE SHIELD

## 2019-02-04 DIAGNOSIS — L501 Idiopathic urticaria: Secondary | ICD-10-CM

## 2019-02-04 DIAGNOSIS — L508 Other urticaria: Secondary | ICD-10-CM

## 2019-02-07 ENCOUNTER — Ambulatory Visit (INDEPENDENT_AMBULATORY_CARE_PROVIDER_SITE_OTHER): Payer: BLUE CROSS/BLUE SHIELD

## 2019-02-07 DIAGNOSIS — J309 Allergic rhinitis, unspecified: Secondary | ICD-10-CM | POA: Diagnosis not present

## 2019-02-15 ENCOUNTER — Ambulatory Visit (INDEPENDENT_AMBULATORY_CARE_PROVIDER_SITE_OTHER): Payer: BLUE CROSS/BLUE SHIELD | Admitting: *Deleted

## 2019-02-15 DIAGNOSIS — J309 Allergic rhinitis, unspecified: Secondary | ICD-10-CM

## 2019-02-25 ENCOUNTER — Ambulatory Visit (INDEPENDENT_AMBULATORY_CARE_PROVIDER_SITE_OTHER): Payer: BLUE CROSS/BLUE SHIELD

## 2019-02-25 DIAGNOSIS — J309 Allergic rhinitis, unspecified: Secondary | ICD-10-CM | POA: Diagnosis not present

## 2019-03-04 ENCOUNTER — Ambulatory Visit: Payer: Self-pay

## 2019-03-05 ENCOUNTER — Other Ambulatory Visit: Payer: Self-pay

## 2019-03-05 ENCOUNTER — Ambulatory Visit (INDEPENDENT_AMBULATORY_CARE_PROVIDER_SITE_OTHER): Payer: BC Managed Care – PPO | Admitting: *Deleted

## 2019-03-05 DIAGNOSIS — L501 Idiopathic urticaria: Secondary | ICD-10-CM

## 2019-03-05 DIAGNOSIS — J309 Allergic rhinitis, unspecified: Secondary | ICD-10-CM

## 2019-03-18 ENCOUNTER — Ambulatory Visit (INDEPENDENT_AMBULATORY_CARE_PROVIDER_SITE_OTHER): Payer: BC Managed Care – PPO

## 2019-03-18 DIAGNOSIS — J309 Allergic rhinitis, unspecified: Secondary | ICD-10-CM

## 2019-04-02 ENCOUNTER — Other Ambulatory Visit: Payer: Self-pay

## 2019-04-02 ENCOUNTER — Ambulatory Visit (INDEPENDENT_AMBULATORY_CARE_PROVIDER_SITE_OTHER): Payer: BC Managed Care – PPO | Admitting: *Deleted

## 2019-04-02 DIAGNOSIS — L501 Idiopathic urticaria: Secondary | ICD-10-CM | POA: Diagnosis not present

## 2019-04-02 DIAGNOSIS — L508 Other urticaria: Secondary | ICD-10-CM

## 2019-04-05 ENCOUNTER — Ambulatory Visit (INDEPENDENT_AMBULATORY_CARE_PROVIDER_SITE_OTHER): Payer: BC Managed Care – PPO | Admitting: *Deleted

## 2019-04-05 DIAGNOSIS — J309 Allergic rhinitis, unspecified: Secondary | ICD-10-CM

## 2019-04-10 NOTE — Progress Notes (Signed)
Vials exp 04-09-2020 

## 2019-04-11 DIAGNOSIS — J301 Allergic rhinitis due to pollen: Secondary | ICD-10-CM

## 2019-04-16 ENCOUNTER — Ambulatory Visit (INDEPENDENT_AMBULATORY_CARE_PROVIDER_SITE_OTHER): Payer: BC Managed Care – PPO

## 2019-04-16 DIAGNOSIS — J302 Other seasonal allergic rhinitis: Secondary | ICD-10-CM | POA: Diagnosis not present

## 2019-04-16 DIAGNOSIS — J3089 Other allergic rhinitis: Secondary | ICD-10-CM

## 2019-04-30 ENCOUNTER — Ambulatory Visit (INDEPENDENT_AMBULATORY_CARE_PROVIDER_SITE_OTHER): Payer: BC Managed Care – PPO | Admitting: *Deleted

## 2019-04-30 DIAGNOSIS — L501 Idiopathic urticaria: Secondary | ICD-10-CM | POA: Diagnosis not present

## 2019-05-03 ENCOUNTER — Ambulatory Visit (INDEPENDENT_AMBULATORY_CARE_PROVIDER_SITE_OTHER): Payer: BC Managed Care – PPO | Admitting: *Deleted

## 2019-05-03 DIAGNOSIS — J309 Allergic rhinitis, unspecified: Secondary | ICD-10-CM

## 2019-05-06 ENCOUNTER — Other Ambulatory Visit: Payer: Self-pay | Admitting: Orthopedic Surgery

## 2019-05-06 DIAGNOSIS — M25531 Pain in right wrist: Secondary | ICD-10-CM

## 2019-05-08 ENCOUNTER — Other Ambulatory Visit: Payer: BC Managed Care – PPO

## 2019-05-08 ENCOUNTER — Ambulatory Visit
Admission: RE | Admit: 2019-05-08 | Discharge: 2019-05-08 | Disposition: A | Discharge status: Home / Self Care | Payer: BC Managed Care – PPO | Source: Ambulatory Visit | Attending: Orthopedic Surgery | Admitting: Orthopedic Surgery

## 2019-05-08 DIAGNOSIS — M25531 Pain in right wrist: Secondary | ICD-10-CM

## 2019-05-09 ENCOUNTER — Other Ambulatory Visit: Payer: BLUE CROSS/BLUE SHIELD

## 2019-05-10 ENCOUNTER — Other Ambulatory Visit (HOSPITAL_COMMUNITY): Payer: BLUE CROSS/BLUE SHIELD

## 2019-05-14 ENCOUNTER — Ambulatory Visit (HOSPITAL_BASED_OUTPATIENT_CLINIC_OR_DEPARTMENT_OTHER)
Admission: RE | Admit: 2019-05-14 | Payer: BLUE CROSS/BLUE SHIELD | Source: Home / Self Care | Admitting: Orthopedic Surgery

## 2019-05-14 SURGERY — OPEN REDUCTION INTERNAL FIXATION (ORIF) WRIST FRACTURE
Anesthesia: Choice | Laterality: Right

## 2019-05-28 ENCOUNTER — Ambulatory Visit: Payer: BC Managed Care – PPO

## 2019-06-17 ENCOUNTER — Telehealth: Payer: Self-pay | Admitting: *Deleted

## 2019-06-17 NOTE — Telephone Encounter (Signed)
FYI-Patient had called [facility] and advised that she no longer wants to continue Xolair

## 2019-06-17 NOTE — Telephone Encounter (Signed)
Ok sounds good. We had discuss stopping it soon since she ended up being positive for alpha gal (when she was off of Xolair for three months), so I think she felt that things were better with avoidance of mammalian meats.   Salvatore Marvel, MD [facility]

## 2019-06-21 ENCOUNTER — Telehealth: Payer: Self-pay | Admitting: Allergy & Immunology

## 2019-06-21 NOTE — Telephone Encounter (Signed)
Patient called and stated that she fell and broke her wrist and is going to therapy. She stated that she would not be in for a little while to get her Xolair or allergy injections. She will call us when she is ready to get them.

## 2019-06-21 NOTE — Telephone Encounter (Signed)
That is fine.  I hope she feels well.  I feel that she broke another bone recently, or maybe within the past year or 2.  I have she gets checked for osteoporosis.  Salvatore Marvel, MD [facility] [place]

## 2019-08-01 ENCOUNTER — Ambulatory Visit: Payer: Self-pay | Admitting: Allergy & Immunology

## 2019-08-02 ENCOUNTER — Ambulatory Visit (INDEPENDENT_AMBULATORY_CARE_PROVIDER_SITE_OTHER): Payer: BC Managed Care – PPO | Admitting: Allergy & Immunology

## 2019-08-02 ENCOUNTER — Encounter: Payer: Self-pay | Admitting: Allergy & Immunology

## 2019-08-02 DIAGNOSIS — T7800XD Anaphylactic reaction due to unspecified food, subsequent encounter: Secondary | ICD-10-CM | POA: Diagnosis not present

## 2019-08-02 DIAGNOSIS — J4520 Mild intermittent asthma, uncomplicated: Secondary | ICD-10-CM

## 2019-08-02 DIAGNOSIS — J3089 Other allergic rhinitis: Secondary | ICD-10-CM | POA: Diagnosis not present

## 2019-08-02 DIAGNOSIS — L508 Other urticaria: Secondary | ICD-10-CM

## 2019-08-02 DIAGNOSIS — J302 Other seasonal allergic rhinitis: Secondary | ICD-10-CM

## 2019-08-02 MED ORDER — PREDNISONE 10 MG PO TABS: ORAL_TABLET | ORAL | 0 refills | Status: DC

## 2019-08-02 NOTE — Progress Notes (Signed)
RE: Mallory Silva MRN: [id number] DOB: Jul 05, 1966 Date of Telemedicine Visit: 08/02/2019  Referring provider: [name], Wilson Oliver, * Primary care provider: [name], Wilson Oliver, MD  Chief Complaint: Asthma (Flare related to sinus infection. Currently on an antibiotic. Last day today. ) and Allergies (Hives and itching x2 weeks. Ate a bite of steak. Stopped Xolair in July. )   Telemedicine Follow Up Visit via Telephone: I connected with Mallory Silva for a follow up on 08/02/19 by telephone and verified that I am speaking with the correct person using two identifiers.   I discussed the limitations, risks, security and privacy concerns of performing an evaluation and management service by telephone and the availability of in person appointments. I also discussed with the patient that there may be a patient responsible charge related to this service. The patient expressed understanding and agreed to proceed.  Patient is at hom e.  Provider is at the office.  Visit start time: 9:35 AM Visit end time: 9:58 AM Insurance consent/check in [facility] consent and medical assistant/nurse: French Anaracy  History of Present Illness:  She is a 53 y.o. female, who is being followed for alpha gal allergy, urticaria, asthma, and allergic rhinitis. Her previous allergy office visit was in February 2020 with myself. At that time, we continued with Xolair monthly as well as immunotherapy for her P/SAR. We also recommended continued avoidance or red meat. Asthma was well controlled with albuterol as needed.   Since the last visit, she has mostly done well. She did broke her right wrist by falling down her back step. They are rather steep and she snapped it. This is the first time that she has broken a bone in her body, but she has had back surgeries in the past.   Asthma/Respiratory Symptom History: She did have a cold and sinus infection after going to [place] to visit her grandchildren. When they got ready to  leave on Sunday, there was a discussion that she had a scratchy sore throat. She ended up getting sicker and then her husband caught it. Hers did not get better and her asthma flares up/ She went to see her PCP on October 26th and she was placed on prednisone. COVID testing was negative and she was placed on doxycycline at that time. She has is taking her last one today.   Allergic Rhinitis Symptom History: She was on allergy shots and she has held off due to the physical therapy issues with her wrist. She wanted to wait so that she would not have to do "so much running". She is using antihistamines, alternating, up to four throughout the day. She has not had any sinus infections at all since the last visit.   Food Allergy Symptom History: She continues to avoid red meat, although she does tell me that she ate a piece of her husband steak recently, not thinking it would cause any issues. She has been breaking out a lot since that time.   Eczema Symptom Symptom History: Her hives have worsened since stopping her Xolair. She knows that she probably ate something that triggered her symptoms. For instance, she had some [place] meat and rotisseries chicken. She did eat a lot of this and she broke out. Her last Xolair was August 2020. She is broken out now and likely needs some prednisone. She had taken the prednisone to help get her asthma under control.   Otherwise, there have been no changes to her past medical history, surgical history, family  history, or social history.  Assessment and Plan:  Mallory Silva is a 53 y.o. 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 I am not convinced that any of this is going to be  positive, but since she has been off of Xolair for so long, we might as well do the testing so that it is valid. We are going to extend the prednisone course so that she is able to make it through the weekend and through the week so that she can be allergy tested on Thursday. Then we will restart her Xolair and her allergen immunotherapy.   Diagnostics: None.  Medication List:  Current Outpatient Medications  Medication Sig Dispense Refill  . acetaminophen (TYLENOL) 325 MG tablet Take 325 mg by mouth every 6 (six) hours as needed. For pain.    Marland Kitchen albuterol (PROAIR HFA) 108 (90 Base) MCG/ACT inhaler Inhale 2 puffs into the lungs every 6 (six) hours as needed for wheezing or shortness of breath (allergic reactions).     . cetirizine (ZYRTEC) 10 MG tablet Take 10 mg by mouth at bedtime.    . clonazePAM (KLONOPIN) 1 MG tablet Take 1 mg by mouth at bedtime. For restless legs    . diphenhydrAMINE (BENADRYL) 25 MG tablet Take 25-50 mg by mouth every 6 (six) hours as needed (allergic reaction).    Marland Kitchen EPINEPHrine (AUVI-Q) 0.3 mg/0.3 mL IJ SOAJ injection Use as directed for severe allergic reaction 2 Device 1  . estradiol (ESTRACE) 0.5 MG tablet Take 0.5 mg by mouth daily.    . famotidine (PEPCID) 20 MG tablet Take 20 mg by mouth 2 (two) times daily.    Marland Kitchen levothyroxine (SYNTHROID, LEVOTHROID) 25 MCG tablet   0  . Melatonin 3 MG TBDP Take 3 mg by mouth at bedtime.     . montelukast (SINGULAIR) 10 MG tablet TAKE 1 TABLET BY MOUTH EVERY NIGHT AT BEDTIME 30 tablet 4  . PREVIDENT 5000 SENSITIVE 1.1-5 % PSTE   10  . Probiotic Product (PRO-BIOTIC BLEND PO) Take 1 capsule by mouth daily.     Current Facility-Administered Medications  Medication Dose Route Frequency Provider Last Rate Last Dose  . omalizumab Arvid Right) injection 300 mg  300 mg Subcutaneous Q28 days Valentina Shaggy, MD   300 mg at 04/30/19 1753   Allergies: Allergies  Allergen Reactions  . Nsaids Anaphylaxis  . Other Shortness Of Breath,  Swelling and Other (See Comments)    CAT DANDER (CATS AND/OR CAT HAIR ON CLOTHES, ETC) also dogs  . Lisinopril Swelling    Eyelids and lip swelling  . Mirapex [Pramipexole Dihydrochloride] Other (See Comments)    Caused insomnia and wheezing  . Nortriptyline Other (See Comments)    Unknown allergic reaction  . Penicillins Itching and Rash    Has patient had a PCN reaction causing immediate rash, facial/tongue/throat swelling, SOB or lightheadedness with hypotension: Yes Has patient had a PCN reaction causing severe rash involving mucus membranes or skin necrosis: No Has patient had a PCN reaction that required hospitalization No Has patient had a PCN reaction occurring within the last 10 years: No If all of the above answers are "NO", then may proceed with Cephalosporin use.  . Sulfa Antibiotics Rash  .  Tape Rash    Please use paper tape   I reviewed her past medical history, social history, family history, and environmental history and no significant changes have been reported from previous visits.  Review of Systems  Constitutional: Negative for activity change, appetite change, chills, diaphoresis and fever.  HENT: Positive for postnasal drip, rhinorrhea and sinus pain. Negative for congestion, sinus pressure and sore throat.   Eyes: Negative for pain, discharge, redness and itching.  Respiratory: Negative for shortness of breath, wheezing and stridor.   Gastrointestinal: Negative for diarrhea, nausea and vomiting.  Endocrine: Negative for cold intolerance and heat intolerance.  Musculoskeletal: Negative for arthralgias, joint swelling and myalgias.  Skin: Positive for rash.       Positive for urticaria.  Allergic/Immunologic: Negative for environmental allergies and food allergies.    Objective:  Physical exam not obtained as encounter was done via telephone.   Previous notes and tests were reviewed.  I discussed the assessment and treatment plan with the patient. The  patient was provided an opportunity to ask questions and all were answered. The patient agreed with the plan and demonstrated an understanding of the instructions.   The patient was advised to call back or seek an in-person evaluation if the symptoms worsen or if the condition fails to improve as anticipated.  I provided 23 minutes of non-face-to-face time during this encounter.  It was my pleasure to participate in Reagan Behlke care today. Please feel free to contact me with any questions or concerns.   Sincerely,  Alfonse Spruce, MD

## 2019-08-08 ENCOUNTER — Ambulatory Visit (INDEPENDENT_AMBULATORY_CARE_PROVIDER_SITE_OTHER): Payer: BC Managed Care – PPO | Admitting: Allergy & Immunology

## 2019-08-08 ENCOUNTER — Other Ambulatory Visit: Payer: Self-pay

## 2019-08-08 ENCOUNTER — Encounter: Payer: Self-pay | Admitting: Allergy & Immunology

## 2019-08-08 ENCOUNTER — Encounter: Payer: BC Managed Care – PPO | Admitting: Allergy & Immunology

## 2019-08-08 DIAGNOSIS — L508 Other urticaria: Secondary | ICD-10-CM | POA: Diagnosis not present

## 2019-08-08 DIAGNOSIS — T7800XD Anaphylactic reaction due to unspecified food, subsequent encounter: Secondary | ICD-10-CM

## 2019-08-08 DIAGNOSIS — L501 Idiopathic urticaria: Secondary | ICD-10-CM | POA: Diagnosis not present

## 2019-08-08 DIAGNOSIS — J302 Other seasonal allergic rhinitis: Secondary | ICD-10-CM

## 2019-08-08 DIAGNOSIS — J4520 Mild intermittent asthma, uncomplicated: Secondary | ICD-10-CM | POA: Diagnosis not present

## 2019-08-08 DIAGNOSIS — J3089 Other allergic rhinitis: Secondary | ICD-10-CM

## 2019-08-08 NOTE — Patient Instructions (Addendum)
1. Chronic urticaria - Xolair restarted today. - Testing to all of the foods was negative today. - There is a the low positive predictive value of food allergy testing and hence the high possibility of false positives. - In contrast, food allergy testing has a high negative predictive value, therefore if testing is negative we can be relatively assured that they are indeed negative.   2. Mild intermittent asthma, uncomplicated - Lung testing deferred today.  - Continue with albuterol as needed.  3. Seasonal and perennial allergic rhinitis - Continue with allergy shots if you are interested in starting these again.  4. 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  - Epinephrine autoinjector is up to date.   5. Return in about 6 months (around 02/05/2020). This can be an in-person, a virtual Webex or a telephone follow up visit.   Please inform us of any Emergency Department visits, hospitalizations, or changes in symptoms. Call us before going to the ED for breathing or allergy symptoms since we might be able to fit you in for a sick visit. Feel free to contact us anytime with any questions, problems, or concerns.  It was a pleasure to see you again today!  Websites that have reliable patient information: 1. American Academy of Asthma, Allergy, and Immunology: www.aaaai.org 2. Food Allergy Research and Education (FARE): foodallergy.org 3. Mothers of Asthmatics: http://www.asthmacommunitynetwork.org 4. American College of Allergy, Asthma, and Immunology: www.acaai.org  "Like" [place] on Facebook and Instagram for our latest updates!      Make sure you are registered to vote! If you have moved or changed any of your contact information, you will need to get this updated before voting!  In some cases, you MAY be able to register to vote online: [email or web address]

## 2019-08-08 NOTE — Progress Notes (Signed)
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 Chronic urticaria - Xolair restarted today. - Testing to all of the foods was negative today. - There is a the low positive predictive value of food allergy testing and hence the high possibility of false positives. - In contrast, food allergy testing has a high negative predictive value, therefore if testing is negative we can be relatively assured that they are indeed negative.   2. Mild intermittent asthma, uncomplicated - Lung testing deferred today.  - Continue with albuterol as needed.  3. Seasonal and perennial allergic rhinitis - Continue with allergy shots if you are interested in starting these again.  4. 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  - Epinephrine autoinjector is up to date.   5. Return in about 6 months (around 02/05/2020). This can be an in-person, a virtual Webex or a telephone follow up visit.    Subjective:   Mallory Silva is a 53 y.o. female presenting today for follow up of No chief complaint on file.   Mallory Silva has a history of the following: Patient Active Problem List   Diagnosis Date Noted  . Anaphylactic shock due to adverse food reaction 05/17/2018  . Seasonal and perennial allergic rhinitis 05/17/2018  . Chronic urticaria 08/28/2017  . Mild intermittent asthma without complication 08/28/2017  . 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 Lumbar post-laminectomy syndrome 01/03/2012    History  obtained from: chart review and patient.  Mallory Silva is a 53 y.o. female presenting for a follow up visit.  She was last seen via telephone visit last week.  At that time, she was having recurrence of her urticaria.  She continued to think that this is related to food.  She requested that we go ahead and test for the entire food panel.  Therefore, she presents today for food testing.  She was interested in restarting the Xolair after the food testing.  Since it has been more than 3 months from her last Xolair, we decided to go ahead and retest her for all of the foods.  Since last visit, she has done well.  She does report to me that she developed hives when she carried a new purse.  It seemed to only occur where the purse was pressing down on her skin.  She also thinks that she is reacting to milk products, including cheese.  She is concerned with [place] as well.  Mallory Silva's asthma has been well controlled. She has not required rescue medication, experienced nocturnal awakenings due to lower respiratory symptoms, nor have activities of daily living been limited. She has required no Emergency Department or Urgent Care visits for her asthma. She has required zero courses of systemic steroids for asthma exacerbations since the last visit. ACT score today is 25, indicating excellent asthma symptom control.   She remains interested in restarting her allergen immunotherapy. She remains on her alternating antihistamines as well as the nasal spray, when she remembers.   Otherwise, there have been no changes to her past medical history, surgical history, family history, or social  history.    Review of Systems  Constitutional: Negative.  Negative for chills, fever, malaise/fatigue and weight loss.  HENT: Negative.  Negative for congestion, ear discharge, ear pain, sinus pain and sore throat.   Eyes: Negative for pain, discharge and redness.  Respiratory: Negative for cough, sputum production, shortness of breath  and wheezing.   Cardiovascular: Negative.  Negative for chest pain and palpitations.  Gastrointestinal: Negative for abdominal pain, constipation, diarrhea, heartburn, nausea and vomiting.  Skin: Positive for itching and rash.  Neurological: Negative for dizziness and headaches.  Endo/Heme/Allergies: Negative for environmental allergies. Does not bruise/bleed easily.       Objective:     Physical Exam: deferred since this was a skin testing visit only   Allergy Studies:    Food Adult Perc - 08/08/19 1600    Time Antigen Placed  0405    Allergen Manufacturer  Waynette ButteryGreer    Location  Back    Number of allergen test  75     Control-buffer 50% Glycerol  Negative    Control-Histamine 1 mg/ml  Negative    1. Peanut  Negative    2. Soybean  Negative    3. Wheat  Negative    4. Sesame  Negative    5. Milk, cow  Negative    6. Egg White, Chicken  Negative    7. Casein  Negative    8. Shellfish Mix  Negative    9. Fish Mix  Negative    10. Cashew  Negative    11. Pecan Food  Negative    12. Walnut Food  Negative    13. Almond  Negative    14. Hazelnut  Negative    15. [place] nut  Negative    16. Coconut  Negative    17. Pistachio  Negative    18. Catfish  Negative    19. Bass  Omitted    20. Trout  Negative    21. Tuna  Negative    22. Salmon  Negative    23. Flounder  Negative    24. Codfish  Negative    25. Shrimp  Negative    26. Crab  Negative    27. Lobster  Negative    28. Oyster  Negative    29. Scallops  Negative    30. Barley  Negative    31. Oat   Negative    32. Rye   Negative    33. Hops  Negative    34. Rice  Negative    35. Cottonseed  Negative    36. Saccharomyces Cerevisiae   Negative    37. Pork  Negative    38. [place] Meat  Negative    39. Chicken Meat  Negative    40. Beef  Negative    41. Lamb  Negative    42. Tomato  Negative    43. White Potato  Negative    44. Sweet Potato  Negative    45. Pea, Green/English  Negative    46. Navy Bean   Negative    47. Mushrooms  Negative    48. Avocado  Negative    49. Onion  Negative    50. Cabbage  Negative    51. Carrots  Negative    52. Celery  Negative    53. Corn  Negative    54. Cucumber  Negative    55. Grape (White seedless)  Negative    56. Orange   Negative  57. Banana  Negative    58. Apple  Negative    59. Peach  Negative    60. Strawberry  Negative    61. Cantaloupe  Negative    62. Watermelon  Negative    63. Pineapple  Negative    64. Chocolate/Cacao bean  Negative    65. Karaya Gum  Negative    66. Acacia (Arabic Gum)  Negative    67. Cinnamon  Negative    68. Nutmeg  Negative    69. Ginger  Negative    70. Garlic  Negative    71. Pepper, black  Negative    72. Mustard  Negative    6. Other  2+   6mg /ml Histamine Dropper      Allergy testing results were read and interpreted by myself, documented by clinical staff.      Salvatore Marvel, MD  [facility]

## 2019-08-09 ENCOUNTER — Encounter: Payer: Self-pay | Admitting: Allergy & Immunology

## 2019-08-09 NOTE — Progress Notes (Signed)
This encounter was created in error - please disregard.

## 2019-08-10 ENCOUNTER — Telehealth: Payer: Self-pay | Admitting: Allergy

## 2019-08-10 LAB — MILK COMPONENT PANEL
F076-IgE Alpha Lactalbumin: <0.1 kU/L
F077-IgE Beta Lactoglobulin: <0.1 kU/L
F078-IgE Casein: <0.1 kU/L

## 2019-08-10 LAB — EGG COMPONENT PANEL
F232-IgE Ovalbumin: <0.1 kU/L
F233-IgE Ovomucoid: <0.1 kU/L

## 2019-08-10 LAB — ALLERGEN, TURKEY, F284: Allergen Turkey IgE: <0.1 kU/L

## 2019-08-10 MED ORDER — PREDNISONE 10 MG PO TABS: ORAL_TABLET | ORAL | 0 refills | Status: DC

## 2019-08-10 NOTE — Telephone Encounter (Signed)
Patient broke out in hives from head to toe yesterday.  She did receive her Xolair injection on Thursday. Patient finished prednisone for a sinus infection. And then she was on another course of prednisone 10mg . She just finished it earlier this week.  She took some benadryl this morning. Patient also took pepcid 20mg  in the morning.  She did not take zyrtec yet.   Advised her to restart pepcid 20mg  BID, zyrtec 10-20mg  BID and will send in prednisone 10mg  tablet - take 1 tablet 1-2 times a day as needed for hives.

## 2019-08-13 ENCOUNTER — Ambulatory Visit: Payer: Self-pay | Admitting: Allergy & Immunology

## 2019-08-15 ENCOUNTER — Telehealth: Payer: Self-pay | Admitting: *Deleted

## 2019-08-15 NOTE — Telephone Encounter (Signed)
Let's restart at Green 0.25 mL of each. One shot per week until back at maintenance.   Salvatore Marvel, MD [facility]

## 2019-08-15 NOTE — Telephone Encounter (Signed)
Patient called and would like to restart Allergy Injections.  Last injection was 05/03/2019-Red Vials and received 0.50 ml of each,G-T-C-D and RW-CR-DM.  Vials have expirations date of 12/04/2019. Please advise on restart of injections. Thanks.

## 2019-08-16 NOTE — Telephone Encounter (Signed)
Patient notified and notation made in Immunotherapy Flow Sheet.

## 2019-08-21 ENCOUNTER — Telehealth: Payer: Self-pay

## 2019-08-21 NOTE — Telephone Encounter (Signed)
Pt is calling to let us know that she tested positive for Covid-19 today.  She had a headache and a raw throat and a temperature. Pt has an appointment for her Xolair injection on December 10, told patient to give [place] a call closer to that date to let us know that she has been fever free for at least 10 days without medication. Pt agreed and will call us back at that time.

## 2019-08-21 NOTE — Telephone Encounter (Signed)
Thanks for letting me know!   Darriona Dehaas, MD [facility]  

## 2019-08-27 NOTE — Progress Notes (Signed)
VIALS EXP 08-27-20 

## 2019-08-28 DIAGNOSIS — J301 Allergic rhinitis due to pollen: Secondary | ICD-10-CM

## 2019-09-05 ENCOUNTER — Ambulatory Visit: Payer: Self-pay

## 2019-09-09 ENCOUNTER — Ambulatory Visit: Payer: Self-pay

## 2019-09-12 ENCOUNTER — Ambulatory Visit: Payer: Self-pay

## 2019-09-17 ENCOUNTER — Ambulatory Visit (INDEPENDENT_AMBULATORY_CARE_PROVIDER_SITE_OTHER): Payer: BC Managed Care – PPO

## 2019-09-17 ENCOUNTER — Other Ambulatory Visit: Payer: Self-pay

## 2019-09-17 DIAGNOSIS — L501 Idiopathic urticaria: Secondary | ICD-10-CM | POA: Diagnosis not present

## 2019-10-17 DIAGNOSIS — L501 Idiopathic urticaria: Secondary | ICD-10-CM | POA: Diagnosis not present

## 2019-10-18 ENCOUNTER — Other Ambulatory Visit: Payer: Self-pay

## 2019-10-18 ENCOUNTER — Ambulatory Visit (INDEPENDENT_AMBULATORY_CARE_PROVIDER_SITE_OTHER): Payer: BC Managed Care – PPO | Admitting: *Deleted

## 2019-10-18 DIAGNOSIS — L501 Idiopathic urticaria: Secondary | ICD-10-CM | POA: Diagnosis not present

## 2019-11-14 DIAGNOSIS — L501 Idiopathic urticaria: Secondary | ICD-10-CM

## 2019-11-15 ENCOUNTER — Other Ambulatory Visit: Payer: Self-pay

## 2019-11-15 ENCOUNTER — Ambulatory Visit (INDEPENDENT_AMBULATORY_CARE_PROVIDER_SITE_OTHER): Payer: BC Managed Care – PPO

## 2019-11-15 DIAGNOSIS — L501 Idiopathic urticaria: Secondary | ICD-10-CM | POA: Diagnosis not present

## 2019-12-13 ENCOUNTER — Ambulatory Visit: Payer: Self-pay

## 2020-01-07 DIAGNOSIS — L501 Idiopathic urticaria: Secondary | ICD-10-CM | POA: Diagnosis not present

## 2020-01-08 ENCOUNTER — Other Ambulatory Visit: Payer: Self-pay

## 2020-01-08 ENCOUNTER — Ambulatory Visit (INDEPENDENT_AMBULATORY_CARE_PROVIDER_SITE_OTHER): Payer: BC Managed Care – PPO

## 2020-01-08 DIAGNOSIS — L501 Idiopathic urticaria: Secondary | ICD-10-CM | POA: Diagnosis not present

## 2020-02-05 ENCOUNTER — Ambulatory Visit: Payer: Self-pay

## 2020-02-06 ENCOUNTER — Ambulatory Visit: Payer: Self-pay

## 2020-02-06 ENCOUNTER — Ambulatory Visit: Payer: BC Managed Care – PPO | Admitting: Allergy & Immunology

## 2020-02-19 DIAGNOSIS — L501 Idiopathic urticaria: Secondary | ICD-10-CM | POA: Diagnosis not present

## 2020-02-20 ENCOUNTER — Ambulatory Visit (INDEPENDENT_AMBULATORY_CARE_PROVIDER_SITE_OTHER): Payer: BC Managed Care – PPO

## 2020-02-20 ENCOUNTER — Ambulatory Visit: Payer: BC Managed Care – PPO | Admitting: Allergy & Immunology

## 2020-02-20 ENCOUNTER — Encounter: Payer: Self-pay | Admitting: Allergy & Immunology

## 2020-02-20 ENCOUNTER — Other Ambulatory Visit: Payer: Self-pay

## 2020-02-20 VITALS — BP 132/76 | HR 88 | Temp 97.3°F | Resp 20 | Ht 58.8 in | Wt 210.4 lb

## 2020-02-20 DIAGNOSIS — J4520 Mild intermittent asthma, uncomplicated: Secondary | ICD-10-CM | POA: Diagnosis not present

## 2020-02-20 DIAGNOSIS — L508 Other urticaria: Secondary | ICD-10-CM

## 2020-02-20 DIAGNOSIS — J302 Other seasonal allergic rhinitis: Secondary | ICD-10-CM

## 2020-02-20 DIAGNOSIS — J3089 Other allergic rhinitis: Secondary | ICD-10-CM

## 2020-02-20 DIAGNOSIS — U071 COVID-19: Secondary | ICD-10-CM

## 2020-02-20 DIAGNOSIS — L501 Idiopathic urticaria: Secondary | ICD-10-CM | POA: Diagnosis not present

## 2020-02-20 MED ORDER — EPINEPHRINE 0.3 MG/0.3ML IJ SOAJ: 0.3000 mg | INTRAMUSCULAR | 1 refills | Status: DC | PRN

## 2020-02-20 NOTE — Progress Notes (Signed)
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 Chronic urticaria - Continue with Xolair. Mallory Silva refilled today.   2. Mild intermittent asthma, uncomplicated - Lung testing looked normal today.  - Continue with albuterol as needed. - It does not seem that you need a controller medication at this time.   3. Seasonal and perennial allergic rhinitis - Continue with cetirizine daily.   4. 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  - Epinephrine autoinjector is up to date.   5. History of COVID19 infection - I am getting antibodies to see if you are still protective. - Consider getting the vaccination, however.   6. Return in about 6 months (around 08/22/2020). This can be an in-person, a virtual Webex or a telephone follow up visit.  Subjective:   Mallory Silva is a 54 y.o. female presenting today for follow up of  Chief Complaint  Patient presents with  . Asthma    Started experiencing some hoarsness yesterday. No other symptoms noted.     Mallory Silva has a history of the following: Patient Active Problem List   Diagnosis Date Noted  . Anaphylactic shock due to adverse food reaction 05/17/2018  . Seasonal and perennial allergic rhinitis 05/17/2018  . Chronic urticaria 08/28/2017  . Mild intermittent asthma without complication 84/69/6295  . 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 Lumbar post-laminectomy syndrome 01/03/2012    History obtained from: chart review and  patient.  Mallory Silva is a 54 y.o. female presenting for a follow up visit.  She was last seen in November 2020.  At that time, we restarted her Xolair.  We did do food allergy testing since she was worried about food allergies and these were all negative.  For her asthma, we deferred lung testing.  We continue with albuterol as needed.  We will continue with her allergy shots for her allergic rhinitis.  She continued to avoid mammalian meat for history of alpha gal.  Since last visit, she has done very well.  Asthma/Respiratory Symptom History: Asthma has been well controlled. She has not had any issues at all. She can tell that she needs to use her albuterol when she is outdoors. She has not needed prednisone at all.   Allergic Rhinitis Symptom History: She has not been on her allergy shots at all. She has not had any problems with her allergies. She is on the cetirizine daily. She does not use a nose spray.  Food Allergy Symptom History: She continues to avoid red meat. She has not had any accidental ingestions at all.  Urticaria Symptom History: She denies any urticarial outbreaks.  She is doing very well with the Xolair.  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  They have been very busy evidently.  Otherwise, there have been no changes to her past medical history, surgical history, family history, or social history.    Review of Systems  Constitutional: Negative.  Negative for fever, malaise/fatigue and weight loss.  HENT: Negative.  Negative for congestion, ear discharge and ear pain.  Eyes: Negative for pain, discharge and redness.  Respiratory: Negative for cough, sputum production, shortness of breath and wheezing.   Cardiovascular: Negative.  Negative for chest pain and palpitations.  Gastrointestinal: Negative for abdominal pain, diarrhea, heartburn, nausea and vomiting.  Skin: Negative.  Negative for itching and rash.  Neurological: Negative  for dizziness and headaches.  Endo/Heme/Allergies: Negative for environmental allergies. Does not bruise/bleed easily.       Objective:   Blood pressure 132/76, pulse 88, temperature (!) 97.3 F (36.3 C), temperature source Temporal, resp. rate 20, height 4' 10.8" (1.494 m), weight 210 lb 6.4 oz (95.4 kg), SpO2 99 %. Body mass index is 42.79 kg/m.    Physical Exam:  Physical Exam  Constitutional: She appears well-developed and well-nourished.  Hard of hearing.  HENT:  Head: Normocephalic and atraumatic.  Right Ear: Tympanic membrane, external ear and ear canal normal. Decreased hearing is noted.  Left Ear: Tympanic membrane, external ear and ear canal normal. Decreased hearing is noted.  Nose: Mucosal edema and rhinorrhea present. No nasal deformity or septal deviation. No epistaxis. Right sinus exhibits no maxillary sinus tenderness and no frontal sinus tenderness. Left sinus exhibits no maxillary sinus tenderness and no frontal sinus tenderness.  Mouth/Throat: Uvula is midline and oropharynx is clear and moist. Mucous membranes are not pale and not dry.  Eyes: Pupils are equal, round, and reactive to light. Conjunctivae and EOM are normal. Right eye exhibits no chemosis and no discharge. Left eye exhibits no chemosis and no discharge. Right conjunctiva is not injected. Left conjunctiva is not injected.  Cardiovascular: Normal rate, regular rhythm and normal heart sounds.  Respiratory: Effort normal and breath sounds normal. No accessory muscle usage. No tachypnea. No respiratory distress. She has no wheezes. She has no rhonchi. She has no rales. She exhibits no tenderness.  Lymphadenopathy:    She has no cervical adenopathy.  Neurological: She is alert.  Skin: No abrasion, no petechiae and no rash noted. Rash is not papular, not vesicular and not urticarial. No erythema. No pallor.  Psychiatric: She has a normal mood and affect.     Diagnostic studies:  Spirometry: results  normal (FEV1: 1.94/87%, FVC: 2.40/84%, FEV1/FVC: 81%).    Spirometry consistent with normal pattern.   Allergy Studies: none        Mallory Bonds, MD  [facility]

## 2020-02-20 NOTE — Patient Instructions (Addendum)
1. Chronic urticaria - Continue with Xolair. Audry Riles refilled today.   2. Mild intermittent asthma, uncomplicated - Lung testing looked normal today.  - Continue with albuterol as needed. - It does not seem that you need a controller medication at this time.   3. Seasonal and perennial allergic rhinitis - Continue with cetirizine daily.   4. 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  - Epinephrine autoinjector is up to date.   5. History of COVID19 infection - I am getting antibodies to see if you are still protective. - Consider getting the vaccination, however.   6. Return in about 6 months (around 08/22/2020). This can be an in-person, a virtual Webex or a telephone follow up visit.   Please inform us of any Emergency Department visits, hospitalizations, or changes in symptoms. Call us before going to the ED for breathing or allergy symptoms since we might be able to fit you in for a sick visit. Feel free to contact us anytime with any questions, problems, or concerns.  It was a pleasure to see you again today!  Websites that have reliable patient information: 1. American Academy of Asthma, Allergy, and Immunology: www.aaaai.org 2. Food Allergy Research and Education (FARE): foodallergy.org 3. Mothers of Asthmatics: http://www.asthmacommunitynetwork.org 4. American College of Allergy, Asthma, and Immunology: www.acaai.org  "Like" [place] on Facebook and Instagram for our latest updates!      Make sure you are registered to vote! If you have moved or changed any of your contact information, you will need to get this updated before voting!  In some cases, you MAY be able to register to vote online: [email or web address]

## 2020-02-21 LAB — SARS-COV-2 SEMI-QUANTITATIVE TOTAL ANTIBODY, SPIKE: SARS-CoV-2 Semi-Quant Total Ab: <0.4 U/mL (ref <0.8)

## 2020-02-21 LAB — SARS-COV-2 ANTIBODIES: SARS-CoV-2 Antibodies: NEGATIVE

## 2020-03-18 DIAGNOSIS — L501 Idiopathic urticaria: Secondary | ICD-10-CM | POA: Diagnosis not present

## 2020-03-19 ENCOUNTER — Other Ambulatory Visit: Payer: Self-pay

## 2020-03-19 ENCOUNTER — Ambulatory Visit (INDEPENDENT_AMBULATORY_CARE_PROVIDER_SITE_OTHER): Payer: BC Managed Care – PPO

## 2020-03-19 DIAGNOSIS — L501 Idiopathic urticaria: Secondary | ICD-10-CM

## 2020-04-15 DIAGNOSIS — L501 Idiopathic urticaria: Secondary | ICD-10-CM | POA: Diagnosis not present

## 2020-04-16 ENCOUNTER — Ambulatory Visit (INDEPENDENT_AMBULATORY_CARE_PROVIDER_SITE_OTHER): Payer: BC Managed Care – PPO

## 2020-04-16 ENCOUNTER — Other Ambulatory Visit: Payer: Self-pay

## 2020-04-16 DIAGNOSIS — L501 Idiopathic urticaria: Secondary | ICD-10-CM

## 2020-05-13 DIAGNOSIS — L501 Idiopathic urticaria: Secondary | ICD-10-CM | POA: Diagnosis not present

## 2020-05-14 ENCOUNTER — Other Ambulatory Visit: Payer: Self-pay

## 2020-05-14 ENCOUNTER — Ambulatory Visit (INDEPENDENT_AMBULATORY_CARE_PROVIDER_SITE_OTHER): Payer: BC Managed Care – PPO

## 2020-05-14 DIAGNOSIS — L501 Idiopathic urticaria: Secondary | ICD-10-CM

## 2020-06-11 ENCOUNTER — Ambulatory Visit: Payer: Self-pay

## 2020-06-17 ENCOUNTER — Ambulatory Visit: Payer: Self-pay

## 2020-06-23 DIAGNOSIS — L501 Idiopathic urticaria: Secondary | ICD-10-CM | POA: Diagnosis not present

## 2020-06-24 ENCOUNTER — Ambulatory Visit (INDEPENDENT_AMBULATORY_CARE_PROVIDER_SITE_OTHER): Payer: BC Managed Care – PPO

## 2020-06-24 DIAGNOSIS — L501 Idiopathic urticaria: Secondary | ICD-10-CM | POA: Diagnosis not present

## 2020-06-25 ENCOUNTER — Ambulatory Visit: Payer: Self-pay

## 2020-07-21 DIAGNOSIS — L501 Idiopathic urticaria: Secondary | ICD-10-CM | POA: Diagnosis not present

## 2020-07-22 ENCOUNTER — Other Ambulatory Visit: Payer: Self-pay

## 2020-07-22 ENCOUNTER — Ambulatory Visit (INDEPENDENT_AMBULATORY_CARE_PROVIDER_SITE_OTHER): Payer: BC Managed Care – PPO | Admitting: *Deleted

## 2020-07-22 DIAGNOSIS — L501 Idiopathic urticaria: Secondary | ICD-10-CM

## 2020-08-24 ENCOUNTER — Ambulatory Visit: Payer: Self-pay

## 2020-08-27 DIAGNOSIS — L501 Idiopathic urticaria: Secondary | ICD-10-CM | POA: Diagnosis not present

## 2020-08-28 ENCOUNTER — Ambulatory Visit (INDEPENDENT_AMBULATORY_CARE_PROVIDER_SITE_OTHER): Payer: BC Managed Care – PPO | Admitting: *Deleted

## 2020-08-28 ENCOUNTER — Other Ambulatory Visit: Payer: Self-pay

## 2020-08-28 DIAGNOSIS — L501 Idiopathic urticaria: Secondary | ICD-10-CM

## 2020-09-24 ENCOUNTER — Ambulatory Visit: Payer: Self-pay

## 2020-10-02 ENCOUNTER — Ambulatory Visit: Payer: Self-pay

## 2020-10-12 ENCOUNTER — Ambulatory Visit: Payer: Self-pay

## 2020-10-14 ENCOUNTER — Ambulatory Visit: Payer: Self-pay

## 2020-10-21 DIAGNOSIS — J4540 Moderate persistent asthma, uncomplicated: Secondary | ICD-10-CM

## 2020-10-22 ENCOUNTER — Ambulatory Visit (INDEPENDENT_AMBULATORY_CARE_PROVIDER_SITE_OTHER): Payer: BC Managed Care – PPO | Admitting: *Deleted

## 2020-10-22 DIAGNOSIS — J4540 Moderate persistent asthma, uncomplicated: Secondary | ICD-10-CM | POA: Diagnosis not present

## 2020-10-29 ENCOUNTER — Encounter (HOSPITAL_COMMUNITY): Payer: Self-pay | Admitting: Emergency Medicine

## 2020-10-29 ENCOUNTER — Emergency Department (HOSPITAL_COMMUNITY)
Admission: EM | Admit: 2020-10-29 | Discharge: 2020-10-30 | Disposition: A | Discharge status: Home / Self Care | Payer: BC Managed Care – PPO | Attending: Emergency Medicine | Admitting: Emergency Medicine

## 2020-10-29 DIAGNOSIS — R42 Dizziness and giddiness: Secondary | ICD-10-CM | POA: Diagnosis not present

## 2020-10-29 DIAGNOSIS — I160 Hypertensive urgency: Secondary | ICD-10-CM

## 2020-10-29 DIAGNOSIS — R55 Syncope and collapse: Secondary | ICD-10-CM

## 2020-10-29 DIAGNOSIS — J45909 Unspecified asthma, uncomplicated: Secondary | ICD-10-CM | POA: Insufficient documentation

## 2020-10-29 LAB — BASIC METABOLIC PANEL
Anion gap: 11 (ref 5–15)
BUN: 7 mg/dL (ref 6–20)
CO2: 23 mmol/L (ref 22–32)
Calcium: 8.8 mg/dL — ABNORMAL LOW (ref 8.9–10.3)
Chloride: 103 mmol/L (ref 98–111)
Creatinine, Ser: 1.03 mg/dL — ABNORMAL HIGH (ref 0.44–1.00)
GFR, Estimated: >60 mL/min (ref >60)
Glucose, Bld: 99 mg/dL (ref 70–99)
Potassium: 3.7 mmol/L (ref 3.5–5.1)
Sodium: 137 mmol/L (ref 135–145)

## 2020-10-29 LAB — TROPONIN I (HIGH SENSITIVITY)
Troponin I (High Sensitivity): 6 ng/L (ref <18)
Troponin I (High Sensitivity): 7 ng/L (ref <18)

## 2020-10-29 LAB — CBC
HCT: 39.7 % (ref 36.0–46.0)
Hemoglobin: 13.5 g/dL (ref 12.0–15.0)
MCH: 31 pg (ref 26.0–34.0)
MCHC: 34 g/dL (ref 30.0–36.0)
MCV: 91.3 fL (ref 80.0–100.0)
Platelets: 308 10*3/uL (ref 150–400)
RBC: 4.35 MIL/uL (ref 3.87–5.11)
RDW: 13.7 % (ref 11.5–15.5)
WBC: 6.9 10*3/uL (ref 4.0–10.5)
nRBC: 0 % (ref 0.0–0.2)

## 2020-10-29 LAB — I-STAT BETA HCG BLOOD, ED (MC, WL, AP ONLY): I-stat hCG, quantitative: <5 m[IU]/mL (ref <5)

## 2020-10-29 NOTE — ED Triage Notes (Signed)
Pt called ems today due to light headed and ran out of bp medications for 2 weeks. BP was ems was elevated in 190's. NO weakness, headache or weakness.

## 2020-10-30 ENCOUNTER — Emergency Department (HOSPITAL_COMMUNITY): Payer: BC Managed Care – PPO

## 2020-10-30 MED ORDER — AMLODIPINE BESYLATE 5 MG PO TABS
10.0000 mg | ORAL_TABLET | Freq: Once | ORAL | Status: AC
  Administered 2020-10-30: 10 mg via ORAL
  Filled 2020-10-30: qty 2

## 2020-10-30 MED ORDER — AMLODIPINE BESYLATE 5 MG PO TABS: 5.0000 mg | ORAL_TABLET | Freq: Every day | ORAL | 0 refills | Status: DC

## 2020-10-30 MED ORDER — MECLIZINE HCL 25 MG PO TABS
25.0000 mg | ORAL_TABLET | Freq: Once | ORAL | Status: AC
  Administered 2020-10-30: 25 mg via ORAL
  Filled 2020-10-30: qty 1

## 2020-10-30 NOTE — Discharge Instructions (Addendum)
Resume taking your amlodipine 5 mg once daily.  Keep a record of your blood pressures for the next week, then follow-up with your primary doctor to discuss the results.  Return to the emergency department if symptoms significantly worsen or change.

## 2020-10-30 NOTE — ED Provider Notes (Signed)
[facility] EMERGENCY DEPARTMENT Provider Note   CSN: [id number] Arrival date & time: 10/29/20  1611     History Chief Complaint  Patient presents with  . Dizziness    Mallory Silva is a 55 y.o. female.  Patient is a 55 year old female with past medical history of asthma, anxiety, hypertension, chronic pain, depression.  She presents today for evaluation of elevated blood pressure and dizziness.  Patient tells me she ran out of her Norvasc several days ago.  Today she began to feel dizzy.  She describes the dizziness as a spinning sensation, followed by almost passing out.  She checked her blood pressure at home and it was well over 200 and presents for evaluation of this.  She denies visual disturbances, but does report headache.  The history is provided by the patient.  Dizziness Quality:  Head spinning and lightheadedness Severity:  Moderate Onset quality:  Sudden Duration:  12 hours Timing:  Constant Progression:  Unchanged Chronicity:  New Relieved by:  Nothing Worsened by:  Nothing Ineffective treatments:  None tried      Past Medical History:  Diagnosis Date  . Anxiety   . Asthma   . Chronic pain syndrome   . Depression   . Lumbar post-laminectomy syndrome   . Urticaria     Patient Active Problem List   Diagnosis Date Noted  . Anaphylactic shock due to adverse food reaction 05/17/2018  . Seasonal and perennial allergic rhinitis 05/17/2018  . Chronic urticaria 08/28/2017  . Mild intermittent asthma without complication 08/28/2017  . 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 Lumbar post-laminectomy syndrome 01/03/2012    Past Surgical History:  Procedure Laterality Date  . ABDOMINAL HYSTERECTOMY    . CESAREAN SECTION    . CHOLECYSTECTOMY    . GASTRIC BYPASS    . HERNIA REPAIR    . miscarriage    . POLYPECTOMY    . SPINE SURGERY    . TONSILECTOMY, ADENOIDECTOMY, BILATERAL MYRINGOTOMY AND  TUBES    . tummy tuck       OB History   No obstetric history on file.     Family History  Problem Relation Age of Onset  . Cancer Mother   . Alzheimer's disease Mother   . Chronic fatigue Mother   . Asthma Mother   . Cancer Father   . Cancer Sister   . Chronic fatigue Sister   . Thyroid disease Sister   . Depression Sister   . Diabetes Sister   . Hyperlipidemia Sister   . Hypertension Sister   . Allergic rhinitis Sister   . Allergic rhinitis Brother   . Eczema Grandchild   . Allergic rhinitis Son   . Eczema Son   . Allergic rhinitis Son   . Angioedema Neg Hx   . Atopy Neg Hx   . Immunodeficiency Neg Hx   . Urticaria Neg Hx     Social History   Tobacco Use  . Smoking status: Never Smoker  . Smokeless tobacco: Never Used  Vaping Use  . Vaping Use: Never used  Substance Use Topics  . Alcohol use: Yes  . Drug use: No    Home Medications Prior to Admission medications   Medication Sig Start Date End Date Taking? Authorizing Provider  acetaminophen (TYLENOL) 325 MG tablet Take 325 mg by mouth every 6 (six) hours as needed. For pain.    [provider]  albuterol (PROAIR HFA) 108 (90 Base) MCG/ACT  inhaler Inhale 2 puffs into the lungs every 6 (six) hours as needed for wheezing or shortness of breath (allergic reactions).     [provider]  cetirizine (ZYRTEC) 10 MG tablet Take 10 mg by mouth at bedtime.    [provider]  chlorthalidone (HYGROTON) 25 MG tablet Take 25 mg by mouth daily.    [provider]  clonazePAM (KLONOPIN) 1 MG tablet Take 1 mg by mouth at bedtime. For restless legs    [provider]  diphenhydrAMINE (BENADRYL) 25 MG tablet Take 25-50 mg by mouth every 6 (six) hours as needed (allergic reaction).    [provider]  EPINEPHrine (AUVI-Q) 0.3 mg/0.3 mL IJ SOAJ injection Inject 0.3 mLs (0.3 mg total) into the muscle as needed for anaphylaxis. Use as directed for severe allergic reaction  02/20/20   Alfonse Spruce, MD  estradiol (ESTRACE) 0.5 MG tablet Take 0.5 mg by mouth daily.    [provider]  famotidine (PEPCID) 20 MG tablet Take 20 mg by mouth 2 (two) times daily.    [provider]  levothyroxine (SYNTHROID, LEVOTHROID) 25 MCG tablet  04/23/18   [provider]  Melatonin 3 MG TBDP Take 3 mg by mouth at bedtime.     [provider]  montelukast (SINGULAIR) 10 MG tablet TAKE 1 TABLET BY MOUTH EVERY NIGHT AT BEDTIME 01/08/19   Alfonse Spruce, MD  predniSONE (DELTASONE) 10 MG tablet Take 1 tablet 1-2 times a day as needed for hives. 08/10/19   Ellamae Sia, DO  PREVIDENT 5000 SENSITIVE 1.1-5 % PSTE  03/06/18   [provider]  Probiotic Product (PRO-BIOTIC BLEND PO) Take 1 capsule by mouth daily.    [provider]    Allergies    Nsaids, Other, Lisinopril, Mirapex [pramipexole dihydrochloride], Nortriptyline, Penicillins, Sulfa antibiotics, and Tape  Review of Systems   Review of Systems  Neurological: Positive for dizziness.  All other systems reviewed and are negative.   Physical Exam Updated Vital Signs BP (!) 177/139   Pulse 80   Temp 99 F (37.2 C) (Oral)   Resp 12   SpO2 99%   Physical Exam Vitals and nursing note reviewed.  Constitutional:      General: She is not in acute distress.    Appearance: She is well-developed and well-nourished. She is not diaphoretic.  HENT:     Head: Normocephalic and atraumatic.  Eyes:     Extraocular Movements: Extraocular movements intact.     Pupils: Pupils are equal, round, and reactive to light.  Cardiovascular:     Rate and Rhythm: Normal rate and regular rhythm.     Heart sounds: No murmur heard. No friction rub. No gallop.   Pulmonary:     Effort: Pulmonary effort is normal. No respiratory distress.     Breath sounds: Normal breath sounds. No wheezing.  Abdominal:     General: Bowel sounds are normal. There is no distension.     Palpations:  Abdomen is soft.     Tenderness: There is no abdominal tenderness.  Musculoskeletal:        General: Normal range of motion.     Cervical back: Normal range of motion and neck supple.  Skin:    General: Skin is warm and dry.  Neurological:     General: No focal deficit present.     Mental Status: She is alert and oriented to person, place, and time.     Cranial Nerves: No  cranial nerve deficit.     Sensory: No sensory deficit.     Motor: No weakness.     Coordination: Coordination normal.     ED Results / Procedures / Treatments   Labs (all labs ordered are listed, but only abnormal results are displayed) Labs Reviewed  BASIC METABOLIC PANEL - Abnormal; Notable for the following components:      Result Value   Creatinine, Ser 1.03 (*)    Calcium 8.8 (*)    All other components within normal limits  CBC  I-STAT BETA HCG BLOOD, ED ([facility], [facility], [facility] ONLY)  TROPONIN I (HIGH SENSITIVITY)  TROPONIN I (HIGH SENSITIVITY)    EKG EKG Interpretation  Date/Time:  Thursday October 29 2020 16:15:56 EST Ventricular Rate:  74 PR Interval:  148 QRS Duration: 84 QT Interval:  390 QTC Calculation: 432 R Axis:   26 Text Interpretation: Normal sinus rhythm Cannot rule out Anterior infarct , age undetermined Abnormal ECG No significant change since 11/11/2016 Confirmed by Geoffery Lyons ([id number]) on 10/30/2020 3:52:56 AM   Radiology No results found.  Procedures Procedures   Medications Ordered in ED Medications  amLODipine (NORVASC) tablet 10 mg (has no administration in time range)  meclizine (ANTIVERT) tablet 25 mg (has no administration in time range)    ED Course  I have reviewed the triage vital signs and the nursing notes.  Pertinent labs & imaging results that were available during my care of the patient were reviewed by me and considered in my medical decision making (see chart for details).    MDM Rules/Calculators/A&P  Patient is a 55 year old female with history of  hypertension off of her medication for several days presenting with elevated blood pressure and dizziness.  Patient is hypertensive upon presentation, however this improved after receiving amlodipine.  Remainder of vital signs are stable and work-up shows no acute abnormalities.  Her laboratory studies are unremarkable and CT scan of the head is negative.  At this point, patient appears appropriate for discharge.  I will prescribe her amlodipine and have her follow-up with her primary doctor after keeping a record of her blood pressures for the next week.  Final Clinical Impression(s) / ED Diagnoses Final diagnoses:  None    Rx / DC Orders ED Discharge Orders    None       Geoffery Lyons, MD 10/30/20 [phone]

## 2020-11-19 ENCOUNTER — Ambulatory Visit: Payer: Self-pay

## 2020-11-26 DIAGNOSIS — L501 Idiopathic urticaria: Secondary | ICD-10-CM | POA: Diagnosis not present

## 2020-11-27 ENCOUNTER — Other Ambulatory Visit: Payer: Self-pay

## 2020-11-27 ENCOUNTER — Ambulatory Visit (INDEPENDENT_AMBULATORY_CARE_PROVIDER_SITE_OTHER): Payer: BC Managed Care – PPO | Admitting: *Deleted

## 2020-11-27 DIAGNOSIS — L501 Idiopathic urticaria: Secondary | ICD-10-CM | POA: Diagnosis not present

## 2020-12-15 DIAGNOSIS — L501 Idiopathic urticaria: Secondary | ICD-10-CM | POA: Diagnosis not present

## 2020-12-24 DIAGNOSIS — L501 Idiopathic urticaria: Secondary | ICD-10-CM | POA: Diagnosis not present

## 2020-12-25 ENCOUNTER — Other Ambulatory Visit: Payer: Self-pay

## 2020-12-25 ENCOUNTER — Ambulatory Visit (INDEPENDENT_AMBULATORY_CARE_PROVIDER_SITE_OTHER): Payer: BC Managed Care – PPO | Admitting: Allergy

## 2020-12-25 DIAGNOSIS — L508 Other urticaria: Secondary | ICD-10-CM

## 2020-12-25 DIAGNOSIS — L501 Idiopathic urticaria: Secondary | ICD-10-CM | POA: Diagnosis not present

## 2021-01-17 NOTE — Patient Instructions (Incomplete)
1. Chronic urticaria - Continue with Xolair. Mallory Silva refilled today.   2. Mild intermittent asthma, uncomplicated - Lung testing looked normal today.  - Continue with albuterol as needed. - It does not seem that you need a controller medication at this time.   3. Seasonal and perennial allergic rhinitis - Continue with cetirizine daily.   4. 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  - Epinephrine autoinjector is up to date.   5. History of COVID19 infection - I am getting antibodies to see if you are still protective. - Consider getting the vaccination, however.

## 2021-01-18 ENCOUNTER — Ambulatory Visit: Payer: BC Managed Care – PPO | Admitting: Family

## 2021-01-22 ENCOUNTER — Ambulatory Visit: Payer: Self-pay

## 2021-01-26 DIAGNOSIS — L501 Idiopathic urticaria: Secondary | ICD-10-CM | POA: Diagnosis not present

## 2021-01-27 ENCOUNTER — Ambulatory Visit: Payer: Self-pay

## 2021-01-27 ENCOUNTER — Other Ambulatory Visit: Payer: Self-pay

## 2021-01-27 ENCOUNTER — Ambulatory Visit (INDEPENDENT_AMBULATORY_CARE_PROVIDER_SITE_OTHER): Payer: BC Managed Care – PPO | Admitting: Family Medicine

## 2021-01-27 ENCOUNTER — Encounter: Payer: Self-pay | Admitting: Family Medicine

## 2021-01-27 VITALS — BP 138/92 | HR 68 | Temp 97.9°F | Resp 18

## 2021-01-27 DIAGNOSIS — L508 Other urticaria: Secondary | ICD-10-CM

## 2021-01-27 DIAGNOSIS — J302 Other seasonal allergic rhinitis: Secondary | ICD-10-CM

## 2021-01-27 DIAGNOSIS — R221 Localized swelling, mass and lump, neck: Secondary | ICD-10-CM | POA: Diagnosis not present

## 2021-01-27 DIAGNOSIS — J3089 Other allergic rhinitis: Secondary | ICD-10-CM

## 2021-01-27 DIAGNOSIS — K219 Gastro-esophageal reflux disease without esophagitis: Secondary | ICD-10-CM

## 2021-01-27 DIAGNOSIS — J4520 Mild intermittent asthma, uncomplicated: Secondary | ICD-10-CM | POA: Diagnosis not present

## 2021-01-27 DIAGNOSIS — L501 Idiopathic urticaria: Secondary | ICD-10-CM | POA: Diagnosis not present

## 2021-01-27 DIAGNOSIS — T781XXA Other adverse food reactions, not elsewhere classified, initial encounter: Secondary | ICD-10-CM

## 2021-01-27 MED ORDER — AUVI-Q 0.3 MG/0.3ML IJ SOAJ: 0.3000 mg | INTRAMUSCULAR | 1 refills | Status: DC | PRN

## 2021-01-27 NOTE — Patient Instructions (Addendum)
Asthma Continue albuterol 2 puffs once every 4 hours as needed for cough or wheeze You may use albuterol 5 to 15 minutes before activity to decrease cough or wheeze  Chronic urticaria Continue Xolair injections 300 mg once every 4 weeks and have access to an epinephrine autoinjector set If your symptoms re-occur, begin a journal of events that occurred for up to 6 hours before your symptoms began including foods and beverages consumed, soaps or perfumes you had contact with, and medications.   Allergic rhinitis Continue cetirizine 10 mg once a day as needed for runny nose or itch Consider saline nasal rinses as needed for nasal symptoms. 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 In case of an allergic reaction, take Benadryl 50 mg every 4 hours, and if life-threatening symptoms occur, inject with AuviQ 0.3 mg. We have ordered a lab to help [place] evaluate your alpha gal allergy.  We will call you when this result becomes available  Throat swelling If your symptoms re-occur, begin a journal of events that occurred for up to 6 hours before your symptoms began including foods and beverages consumed, soaps or perfumes you had contact with, and medications.  We have ordered a lab to help [place] evaluate your throat swelling.  We will call you as soon as the result becomes available  Reflux Continue dietary and lifestyle modifications as listed below Continue Pepcid daily for reflux control  Call the clinic if this treatment plan is not working well for you  Follow up in 2 months or sooner if needed.  Reducing Pollen Exposure The American Academy of Allergy, Asthma and Immunology suggests the following steps to reduce your exposure to pollen during allergy seasons. 1. Do not hang sheets or clothing out  to dry; pollen may collect on these items. 2. Do not mow lawns or spend time around freshly cut grass; mowing stirs up pollen. 3. Keep windows closed at night.  Keep car windows closed while driving. 4. Minimize morning activities outdoors, a time when pollen counts are usually at their highest. 5. Stay indoors as much as possible when pollen counts or humidity is high and on windy days when pollen tends to remain in the air longer. 6. Use air conditioning when possible.  Many air conditioners have filters that trap the pollen spores. 7. Use a HEPA room air filter to remove pollen form the indoor air you breathe.   Control of Dust Mite Allergen Dust mites play a major role in allergic asthma and rhinitis. They occur in environments with high humidity wherever human skin is found. Dust mites absorb humidity from the atmosphere (ie, they do not drink) and feed on organic matter (including shed human and animal skin). Dust mites are a microscopic type of insect that you cannot see with the naked eye. High levels of dust mites have been detected from mattresses, pillows, carpets, upholstered furniture, bed covers, clothes, soft toys and any woven material. The principal allergen of the dust mite is found in its feces. A gram of dust may contain 1,000 mites and 250,000 fecal particles. Mite antigen is easily measured in the air during house cleaning activities. Dust mites do not bite and do not cause harm to humans, other than by triggering allergies/asthma.  Ways to decrease your exposure to dust mites in your home:  1. Encase mattresses, box springs and pillows with a mite-impermeable barrier or cover  2. Wash sheets, blankets and drapes weekly in hot water (130 F) with detergent and dry them in a dryer on the hot setting.  3. Have the room cleaned frequently with a vacuum cleaner and a damp dust-mop. For carpeting or rugs, vacuuming with a vacuum cleaner equipped with a high-efficiency particulate  air (HEPA) filter. The dust mite allergic individual should not be in a room which is being cleaned and should wait 1 hour after cleaning before going into the room.  4. Do not sleep on upholstered furniture (eg, couches).  5. If possible removing carpeting, upholstered furniture and drapery from the home is ideal. Horizontal blinds should be eliminated in the rooms where the person spends the most time (bedroom, study, television room). Washable vinyl, roller-type shades are optimal.  6. Remove all non-washable stuffed toys from the bedroom. Wash stuffed toys weekly like sheets and blankets above.  7. Reduce indoor humidity to less than 50%. Inexpensive humidity monitors can be purchased at most hardware stores. Do not use a humidifier as can make the problem worse and are not recommended.  Control of Cockroach Allergen  Cockroach allergen has been identified as an important cause of acute attacks of asthma, especially in urban settings.  There are fifty-five species of cockroach that exist in the [place], however only three, the [place], [place] species produce allergen that can affect patients with Asthma.  Allergens can be obtained from fecal particles, egg casings and secretions from cockroaches.    1. Remove food sources. 2. Reduce access to water. 3. Seal access and entry points. 4. Spray runways with 0.5-1% Diazinon or Chlorpyrifos 5. Blow boric acid power under stoves and refrigerator. 6. Place bait stations (hydramethylnon) at feeding sites.

## 2021-01-27 NOTE — Progress Notes (Signed)
[address] [name] [address] Dept: [phone]  FOLLOW UP NOTE  Patient ID: LURLENE Silva, female    DOB: Sep 11, 1966  Age: 55 y.o. MRN: [id number] Date of Office Visit: 01/27/2021  Assessment  Chief Complaint: Allergy Testing and Allergic Reaction  HPI Mallory Silva is a 55 year old female who presents to the clinic for an evaluation of throat swelling occurring 3 separate times about 2 weeks ago. She was last seen in this clinic on 02/20/2020 by Dr. Dellis Anes for evaluation of asthma, allergic rhinitis, chronic urticaria, and alpha gal allergy. At today's visit, she reports that she has experienced throat and tongue swelling occurring 3 times over the last 2 weeks. She reports that she felt as though there was a lump in her throat and it was difficult to swallow during these times. She reports that she took Benadryl with swelling resolution over about 1 day. She did not use her epinephrine auto-injector. She does report that she had been trying a new snack cracker in an effort to keep her diet low in sodium. She reports the ingredients are all foods that she has tolerated well in the past. She is currently avoiding mammalian meats and tolerating milk and cheese without adverse reaction. Asthma is reported as well controlled with no shortness of breath, cough, or wheeze. She rarely uses her albuterol inhaler. Allergic rhinitis is reported as moderately well controlled with symptoms including occasional clear rhinorrhea and some post nasal drainage for which she continues cetirizine 10 mg once a day. She stopped allergen immunotherapy injections during the beginning of the COVID outbreak. She continues Xolair for chronic urticaria with no breakouts for about 1 year. Reflux is reported as well controlled with no symptoms including heartburn or reflux and she continues famotidine daily. She does report that she has tried several different medications to control her blood pressure. She  reports lower extremity swelling with chlorthalidone and amlodipine and she has stopped taking these medications at this time. She began taking verapamil on 01/13/2021 which is after she had the throat swelling events. Her current medications are listed in the chart.    Drug Allergies:  Allergies  Allergen Reactions  . Nsaids Anaphylaxis  . Other Shortness Of Breath, Swelling and Other (See Comments)    CAT DANDER (CATS AND/OR CAT HAIR ON CLOTHES, ETC) also dogs  . Lisinopril Swelling    Eyelids and lip swelling  . Mirapex [Pramipexole Dihydrochloride] Other (See Comments)    Caused insomnia and wheezing  . Nortriptyline Other (See Comments)    Unknown allergic reaction  . Penicillins Itching and Rash    Has patient had a PCN reaction causing immediate rash, facial/tongue/throat swelling, SOB or lightheadedness with hypotension: Yes Has patient had a PCN reaction causing severe rash involving mucus membranes or skin necrosis: No Has patient had a PCN reaction that required hospitalization No Has patient had a PCN reaction occurring within the last 10 years: No If all of the above answers are "NO", then may proceed with Cephalosporin use.  . Sulfa Antibiotics Rash  . Tape Rash    Please use paper tape    Physical Exam: BP (!) 138/92 (BP Location: Left Arm, Patient Position: Sitting, Cuff Size: Normal)   Pulse 68   Temp 97.9 F (36.6 C) (Temporal)   Resp 18   SpO2 97%    Physical Exam Vitals reviewed.  Constitutional:      Appearance: Normal appearance.  HENT:     Head:  Normocephalic and atraumatic.     Right Ear: Tympanic membrane normal.     Left Ear: Tympanic membrane normal.     Nose:     Comments: Bilateral nares slightly erythematous with clear nasal drainage noted. Pharynx normal. Ears normal. Eyes normal.    Mouth/Throat:     Pharynx: Oropharynx is clear.  Eyes:     Conjunctiva/sclera: Conjunctivae normal.  Cardiovascular:     Rate and Rhythm: Normal rate and  regular rhythm.     Heart sounds: Normal heart sounds. No murmur heard.   Pulmonary:     Effort: Pulmonary effort is normal.     Breath sounds: Normal breath sounds.     Comments: Lungs clear to auscultation Musculoskeletal:        General: Normal range of motion.     Cervical back: Normal range of motion and neck supple.  Skin:    General: Skin is warm and dry.  Neurological:     Mental Status: She is alert and oriented to person, place, and time.  Psychiatric:        Mood and Affect: Mood normal.        Behavior: Behavior normal.        Thought Content: Thought content normal.        Judgment: Judgment normal.     Diagnostics: FVC 2.18, FEV1 1.71. Predicted FVC 2.72, predicted FEV1 2.18. Spirometry indicates normal ventilatory function.   Assessment and Plan: 1. Mild intermittent asthma without complication   2. Throat swelling   3. Chronic urticaria   4. Seasonal and perennial allergic rhinitis   5. Gastroesophageal reflux disease, unspecified whether esophagitis present   6. Allergic reaction to alpha-gal     Meds ordered this encounter  Medications  . AUVI-Q 0.3 MG/0.3ML SOAJ injection    Sig: Inject 0.3 mg into the muscle as needed for anaphylaxis. Use as directed for severe allergic reaction    Dispense:  2 each    Refill:  1    Patient Instructions  Asthma Continue albuterol 2 puffs once every 4 hours as needed for cough or wheeze You may use albuterol 5 to 15 minutes before activity to decrease cough or wheeze  Chronic urticaria Continue Xolair injections 300 mg once every 4 weeks and have access to an epinephrine autoinjector set If your symptoms re-occur, begin a journal of events that occurred for up to 6 hours before your symptoms began including foods and beverages consumed, soaps or perfumes you had contact with, and medications.   Allergic rhinitis Continue cetirizine 10 mg once a day as needed for runny nose or itch Consider saline nasal rinses  as needed for nasal symptoms. 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 In case of an allergic reaction, take Benadryl 50 mg every 4 hours, and if life-threatening symptoms occur, inject with AuviQ 0.3 mg. We have ordered a lab to help [place] evaluate your alpha gal allergy.  We will call you when this result becomes available  Throat swelling If your symptoms re-occur, begin a journal of events that occurred for up to 6 hours before your symptoms began including foods and beverages consumed, soaps or perfumes you had contact with, and medications.  We have ordered a lab to help [place] evaluate your throat swelling.  We will call you as soon  as the result becomes available  Reflux Continue dietary and lifestyle modifications as listed below Continue Pepcid daily for reflux control  Call the clinic if this treatment plan is not working well for you  Follow up in 2 months or sooner if needed.   Return in about 2 months (around 03/29/2021), or if symptoms worsen or fail to improve.    Thank you for the opportunity to care for this patient.  Please do not hesitate to contact me with questions.  Thermon Leyland, FNP [facility]

## 2021-01-29 ENCOUNTER — Telehealth: Payer: Self-pay | Admitting: Family Medicine

## 2021-01-29 NOTE — Telephone Encounter (Signed)
Patient reports that she is experiencing less post nasal drainage since she has restarted cetirizine. She has been made aware that azelastine may help control post nasal drainage which may be leading to a full throat sensation. She is not interested in this option at this time, however, she will call the clinic id she should like to try this medication.

## 2021-02-02 LAB — C1 ESTERASE INHIBITOR: C1INH SerPl-mCnc: 34 mg/dL (ref 21–39)

## 2021-02-02 LAB — ALPHA-GAL PANEL
Allergen Lamb IgE: 0.97 kU/L — AB
Beef IgE: 1.3 kU/L — AB
IgE (Immunoglobulin E), Serum: 372 IU/mL (ref 6–495)
O215-IgE Alpha-Gal: 2.81 kU/L — AB
Pork IgE: 0.66 kU/L — AB

## 2021-02-02 LAB — C4 COMPLEMENT: Complement C4, Serum: 17 mg/dL (ref 12–38)

## 2021-02-02 LAB — C1 ESTERASE INHIBITOR, FUNCTIONAL: C1INH Functional/C1INH Total MFr SerPl: >100 %mean normal

## 2021-02-02 NOTE — Telephone Encounter (Signed)
Patient called back and wanted to let Thurston Hole know that she did not take her blood pressure medication over the weekend and was fine, but when she took her blood pressure medication yesterday, her throat started to swell. Patient is going to call her family doctor to see if they can change the medication. Informed patient to call us with an update from her primary doctor.

## 2021-02-03 NOTE — Telephone Encounter (Signed)
Thank you :)

## 2021-02-03 NOTE — Progress Notes (Signed)
Can you please call this patient and let her know that her lab work has returned. Her alpha gal panel was positive and she should avoid mammalian meat/ products and have an epinephrine auto-injector set available at all times. Please send out avoidance measures. The screening for hereditary angioedema was negative. It is possible that she has alpha gal allergy in addition to swelling of her throat due to verapamil. She should discuss the possibility of switching to another medication to control her blood pressure. It is possible that the verapamil contains gelatin as a filler which could aggravate alpha gal. Thank you

## 2021-02-23 ENCOUNTER — Telehealth: Payer: Self-pay

## 2021-02-23 NOTE — Telephone Encounter (Signed)
Noted  

## 2021-02-23 NOTE — Telephone Encounter (Signed)
Thank you :)

## 2021-02-23 NOTE — Telephone Encounter (Signed)
Just a FYI Update:  Patient called stating she wants to stop her Xolair for 3 months so she can be allergy tested since she is having problems with throat/tounge swelling. I canceled her appointment for 02/24/2021.   Thanks

## 2021-02-24 ENCOUNTER — Ambulatory Visit: Payer: Self-pay

## 2021-04-13 ENCOUNTER — Telehealth: Payer: Self-pay | Admitting: Family Medicine

## 2021-04-13 NOTE — Telephone Encounter (Signed)
Pt is still having swelling and hives. She does want skin testing but not sure what else to test too. She has not taken xolair since 01/27/2021, being on the [place] has helped her. Pcp has her on prednisone.. she is doing benadryl several times a day but not every day.

## 2021-04-13 NOTE — Telephone Encounter (Signed)
Patient called and said that she would like to be skin tested a soon as possible because she is having break outs. [phone].

## 2021-04-13 NOTE — Telephone Encounter (Signed)
Please advise if we can schedule for skin testing

## 2021-04-13 NOTE — Telephone Encounter (Signed)
Has she had any further swelling episodes? Can you please find out what she is interested in testing for? Please review that she has had skin testing positive for g/t/rw/dm/cr. Lab testing was positive to grass and weed pollen. Lab testing also positive to alpha gal. The adult food panel was negative as well as raspberry, vanilla, nuts, cinnamon, apple, blueberry, and ginger. If there is anything that was not previously tested that she is concerned about we can test that. Thank you

## 2021-04-15 NOTE — Telephone Encounter (Signed)
TRIED to call pt but it just kept ringing

## 2021-04-16 NOTE — Telephone Encounter (Signed)
Patient called back and was advised of plan. She stated that she will keep note and track of those items. She states that she is still taking Benadryl twice daily and the Pepcid twice daily. She has almost been off of her Xolair for 3 months so we did go ahead and schedule skin testing for that time that puts her at 3 months off of Xolair. She did state that her Prednisone ends tomorrow and she was wondering if she may have more to get her through until then.

## 2021-04-16 NOTE — Telephone Encounter (Signed)
Called patient and advised. Patient verbalized understanding and will call back if she needs anything further until then.

## 2021-04-16 NOTE — Telephone Encounter (Signed)
Sounds good. We can get her on the schedule and monitor how she does between now and then. Thank you

## 2021-04-19 NOTE — Telephone Encounter (Signed)
Patient states that she wants to know what recommendations you have for hypoallergenic pillows. She states that she is currently going through active flares. She states that her PCP is sending prednisone for her as this is the only medication that is helping with her flare up and she wanted our office to be aware. Patient states that she will try to stay off of the prednisone before her appointment on August 4 with Anne. Patient states that she is to have skin testing on that day for allergies. Patient would like to hear back from our staff informing of what she can do for hypoallergenic pillows and sheets as she is ordering new bedding today to try and help with her flare ups. Please advise on what is best for patient to use. Thank you.

## 2021-04-19 NOTE — Telephone Encounter (Signed)
Can you please let her know of the dust mite free covers. This will provide a barrier between her bedding and pillow. Please also send out allergen avoidance measures directed toward pollen, pets, cockroach, and dust mite. Thank you

## 2021-04-28 NOTE — Progress Notes (Signed)
[address] [name] [address] Dept: [phone]  FOLLOW UP NOTE  Patient ID: Mallory Silva, female    DOB: 07/04/1966  Age: 55 y.o. MRN: [id number] Date of Office Visit: 04/29/2021  Assessment  Chief Complaint: Asthma (Act -25 ), Urticaria (Cold showers help - on/off for months has been using prednisone and benadryl to treat it. She came for allergy testing today and has been off benadryl for 3 days. Had bump on lips and throat cause difficulty swallowing ), and Pruritus  HPI Mallory Silva is a 55 year old female who presents to the clinic for follow-up visit.  She was last seen in this clinic on 01/27/2021 for evaluation of asthma, throat swelling, chronic urticaria, allergic rhinitis, reflux, and alpha gal allergy.  At that time, she reported feeling throat and tongue swelling on several separate occasions.  She reports that Benadryl did relieve that swelling.  She was concerned with food allergies at that time.  She stopped taking Xolair injections at that time.  At today's visit, she reports that her asthma has been well controlled with no shortness of breath, cough, or wheeze with activity or rest.  She has not needed her albuterol since her last visit to this clinic.  Allergic rhinitis is reported as moderately well controlled with symptoms including occasional clear rhinorrhea and some postnasal drainage for which she continues cetirizine 10 mg once a day and occasionally uses azelastine.  She stopped allergen immunotherapy during the beginning of the COVID outbreak with her last allergen immunotherapy injection on 05/03/2019.  Urticaria is reported as poorly controlled with hives occurring in late June through today with little relief of symptoms.  She continues cetirizine twice a day, famotidine 20 mg twice a day, and frequent administration of Benadryl.  She reports she has been on prednisone 2 separate times over the last month for relief of hives.  She reports her symptoms have  flared over the last 3 days as she has been out of prednisone and has stopped all antihistamines for skin testing to evaluate food allergies.  She reports several incidences of swelling of her lips and feeling like she is swallowing over a lump.  With the swelling, she continues to experience urticaria.  She does not experience cardiopulmonary or gastrointestinal symptoms with the swelling.  Her symptoms were well controlled with no breakouts for 1 year while continuing on Xolair.  She continues to avoid mammalian meat, however, occasionally consumes milk and cheese.  She cannot state a correlation between milk and cheese consumption and urticaria.  Her current medications are listed in the chart.   Drug Allergies:  Allergies  Allergen Reactions   Nsaids Anaphylaxis   Other Shortness Of Breath, Swelling and Other (See Comments)    CAT DANDER (CATS AND/OR CAT HAIR ON CLOTHES, ETC) also dogs   Lisinopril Swelling    Eyelids and lip swelling   Mirapex [Pramipexole Dihydrochloride] Other (See Comments)    Caused insomnia and wheezing   Nortriptyline Other (See Comments)    Unknown allergic reaction   Penicillins Itching and Rash    Has patient had a PCN reaction causing immediate rash, facial/tongue/throat swelling, SOB or lightheadedness with hypotension: Yes Has patient had a PCN reaction causing severe rash involving mucus membranes or skin necrosis: No Has patient had a PCN reaction that required hospitalization No Has patient had a PCN reaction occurring within the last 10 years: No If all of the above answers are "NO", then may proceed with Cephalosporin  use.   Sulfa Antibiotics Rash   Tape Rash    Please use paper tape    Physical Exam: BP 126/86   Pulse 99   Temp 98 F (36.7 C)   Resp 16   Ht 4\' 11"  (1.499 m)   Wt 200 lb (90.7 kg)   SpO2 95%   BMI 40.40 kg/m    Physical Exam Vitals reviewed.  Constitutional:      Appearance: Normal appearance.  HENT:     Head:  Normocephalic and atraumatic.     Right Ear: Tympanic membrane normal.     Left Ear: Tympanic membrane normal.     Nose:     Comments: Bilateral nares normal.  Pharynx normal.  Ears normal.  Eyes normal.    Mouth/Throat:     Pharynx: Oropharynx is clear.  Eyes:     Conjunctiva/sclera: Conjunctivae normal.  Cardiovascular:     Rate and Rhythm: Normal rate and regular rhythm.     Heart sounds: Normal heart sounds. No murmur heard. Pulmonary:     Effort: Pulmonary effort is normal.     Breath sounds: Normal breath sounds.     Comments: Lungs clear to auscultation Musculoskeletal:        General: Normal range of motion.     Cervical back: Normal range of motion and neck supple.  Skin:    General: Skin is warm.     Comments: Scattered hives occurring on all areas of her body with the exception of her face.  No open areas or drainage noted  Neurological:     Mental Status: She is alert and oriented to person, place, and time.  Psychiatric:        Mood and Affect: Mood normal.        Behavior: Behavior normal.        Thought Content: Thought content normal.        Judgment: Judgment normal.    Diagnostics: FVC 2.52, FEV1 2.03.  Predicted FVC 2.80, predicted FEV1 2.18.  Spirometry indicates normal ventilatory function.  Assessment and Plan: 1. Chronic urticaria   2. Mild intermittent asthma without complication   3. Seasonal and perennial allergic rhinitis   4. Gastroesophageal reflux disease, unspecified whether esophagitis present   5. Anaphylactic shock due to food, subsequent encounter     Meds ordered this encounter  Medications   cetirizine (ZYRTEC) 10 MG tablet    Sig: Take one tablet 1-2 times a day as directed for hives    Dispense:  60 tablet    Refill:  5   famotidine (PEPCID) 20 MG tablet    Sig: Take 1 tablet (20 mg total) by mouth 2 (two) times daily. Take 1 tablet by mouth 1-2 times a day as directed for hives    Dispense:  60 tablet    Refill:  5    albuterol (VENTOLIN HFA) 108 (90 Base) MCG/ACT inhaler    Sig: Inhale 2 puffs into the lungs every 6 (six) hours as needed for wheezing or shortness of breath (allergic reactions).    Dispense:  18 g    Refill:  1    Patient Instructions  Asthma Continue albuterol 2 puffs once every 4 hours as needed for cough or wheeze You may use albuterol 5 to 15 minutes before activity to decrease cough or wheeze  Hives (urticaria) Take the lease amount of medications while remaining hive free Cetirizine (Zyrtec) 10mg  twice a day and famotidine (Pepcid) 20 mg twice a day.  If no symptoms for 7-14 days then decrease to. Cetirizine (Zyrtec) 10mg  twice a day and famotidine (Pepcid) 20 mg once a day.  If no symptoms for 7-14 days then decrease to. Cetirizine (Zyrtec) 10mg  twice a day.  If no symptoms for 7-14 days then decrease to. Cetirizine (Zyrtec) 10mg  once a day.  May use Benadryl (diphenhydramine) as needed for breakthrough hives       If symptoms return, then step up dosage Keep a detailed symptom journal including foods eaten, contact with allergens, medications taken, weather changes.  Consider restarting Xolair injections as your hives were well controlled while you were on this medication Prednisone 10 mg tablets. Take 2 tablets once a day for 4 days, then take 1 tablet on the 5th day, then stop.    Allergic rhinitis Continue cetirizine 10 mg once a day as needed for runny nose or itch Consider saline nasal rinses as needed for nasal symptoms. 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 In case of an allergic reaction, take Benadryl 50 mg every 4 hours, and if life-threatening symptoms occur, inject with AuviQ 0.3 mg. Begin to avoid dairy products to see if this eliminates your hives We have  ordered a lab to help evaluate your alpha gal allergy.  We will call you when this result becomes available  Throat swelling If your symptoms re-occur, begin a journal of events that occurred for up to 6 hours before your symptoms began including foods and beverages consumed, soaps or perfumes you had contact with, and medications.   Reflux Continue dietary and lifestyle modifications as listed below Continue Pepcid daily for reflux control  Call the clinic if this treatment plan is not working well for you  Follow up in 2 months or sooner if needed.   Return in about 2 months (around 06/29/2021), or if symptoms worsen or fail to improve.    Thank you for the opportunity to care for this patient.  Please do not hesitate to contact me with questions.  , FNP [facility]

## 2021-04-29 ENCOUNTER — Ambulatory Visit (INDEPENDENT_AMBULATORY_CARE_PROVIDER_SITE_OTHER): Payer: BC Managed Care – PPO | Admitting: Family Medicine

## 2021-04-29 ENCOUNTER — Other Ambulatory Visit: Payer: Self-pay

## 2021-04-29 ENCOUNTER — Encounter: Payer: Self-pay | Admitting: Family Medicine

## 2021-04-29 VITALS — BP 126/86 | HR 99 | Temp 98.0°F | Resp 16 | Ht 59.0 in | Wt 200.0 lb

## 2021-04-29 DIAGNOSIS — K219 Gastro-esophageal reflux disease without esophagitis: Secondary | ICD-10-CM | POA: Diagnosis not present

## 2021-04-29 DIAGNOSIS — J3089 Other allergic rhinitis: Secondary | ICD-10-CM | POA: Diagnosis not present

## 2021-04-29 DIAGNOSIS — J4520 Mild intermittent asthma, uncomplicated: Secondary | ICD-10-CM

## 2021-04-29 DIAGNOSIS — L508 Other urticaria: Secondary | ICD-10-CM | POA: Diagnosis not present

## 2021-04-29 DIAGNOSIS — T7800XD Anaphylactic reaction due to unspecified food, subsequent encounter: Secondary | ICD-10-CM

## 2021-04-29 DIAGNOSIS — J302 Other seasonal allergic rhinitis: Secondary | ICD-10-CM

## 2021-04-29 NOTE — Patient Instructions (Addendum)
Asthma Continue albuterol 2 puffs once every 4 hours as needed for cough or wheeze You may use albuterol 5 to 15 minutes before activity to decrease cough or wheeze  Hives (urticaria) Take the lease amount of medications while remaining hive free Cetirizine (Zyrtec) 10mg  twice a day and famotidine (Pepcid) 20 mg twice a day. If no symptoms for 7-14 days then decrease to. Cetirizine (Zyrtec) 10mg  twice a day and famotidine (Pepcid) 20 mg once a day.  If no symptoms for 7-14 days then decrease to. Cetirizine (Zyrtec) 10mg  twice a day.  If no symptoms for 7-14 days then decrease to. Cetirizine (Zyrtec) 10mg  once a day.  May use Benadryl (diphenhydramine) as needed for breakthrough hives       If symptoms return, then step up dosage Keep a detailed symptom journal including foods eaten, contact with allergens, medications taken, weather changes.  Consider restarting Xolair injections as your hives were well controlled while you were on this medication Prednisone 10 mg tablets. Take 2 tablets once a day for 4 days, then take 1 tablet on the 5th day, then stop.    Allergic rhinitis Continue cetirizine 10 mg once a day as needed for runny nose or itch Consider saline nasal rinses as needed for nasal symptoms. 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 In case of an allergic reaction, take Benadryl 50 mg every 4 hours, and if life-threatening symptoms occur, inject with AuviQ 0.3 mg. Begin to avoid dairy products to see if this eliminates your hives We have ordered a lab to help evaluate your alpha gal allergy.  We will call you when this result becomes available  Throat swelling If your symptoms re-occur, begin a journal of events that occurred for up to 6 hours before your symptoms began  including foods and beverages consumed, soaps or perfumes you had contact with, and medications.   Reflux Continue dietary and lifestyle modifications as listed below Continue Pepcid daily for reflux control  Call the clinic if this treatment plan is not working well for you  Follow up in 2 months or sooner if needed.  Reducing Pollen Exposure The American Academy of Allergy, Asthma and Immunology suggests the following steps to reduce your exposure to pollen during allergy seasons. Do not hang sheets or clothing out to dry; pollen may collect on these items. Do not mow lawns or spend time around freshly cut grass; mowing stirs up pollen. Keep windows closed at night.  Keep car windows closed while driving. Minimize morning activities outdoors, a time when pollen counts are usually at their highest. Stay indoors as much as possible when pollen counts or humidity is high and on windy days when pollen tends to remain in the air longer. Use air conditioning when possible.  Many air conditioners have filters that trap the pollen spores. Use a HEPA room air filter to remove pollen form the indoor air you breathe.   Control of Dust Mite Allergen Dust mites play a major role in allergic asthma and rhinitis. They occur in environments with high humidity wherever human skin is found. Dust mites absorb humidity from the atmosphere (ie, they do not drink) and feed on organic matter (including shed human and animal skin). Dust mites are a microscopic type of insect that you cannot see with the naked eye. High  levels of dust mites have been detected from mattresses, pillows, carpets, upholstered furniture, bed covers, clothes, soft toys and any woven material. The principal allergen of the dust mite is found in its feces. A gram of dust may contain 1,000 mites and 250,000 fecal particles. Mite antigen is easily measured in the air during house cleaning activities. Dust mites do not bite and do not cause  harm to humans, other than by triggering allergies/asthma.  Ways to decrease your exposure to dust mites in your home:  1. Encase mattresses, box springs and pillows with a mite-impermeable barrier or cover  2. Wash sheets, blankets and drapes weekly in hot water (130 F) with detergent and dry them in a dryer on the hot setting.  3. Have the room cleaned frequently with a vacuum cleaner and a damp dust-mop. For carpeting or rugs, vacuuming with a vacuum cleaner equipped with a high-efficiency particulate air (HEPA) filter. The dust mite allergic individual should not be in a room which is being cleaned and should wait 1 hour after cleaning before going into the room.  4. Do not sleep on upholstered furniture (eg, couches).  5. If possible removing carpeting, upholstered furniture and drapery from the home is ideal. Horizontal blinds should be eliminated in the rooms where the person spends the most time (bedroom, study, television room). Washable vinyl, roller-type shades are optimal.  6. Remove all non-washable stuffed toys from the bedroom. Wash stuffed toys weekly like sheets and blankets above.  7. Reduce indoor humidity to less than 50%. Inexpensive humidity monitors can be purchased at most hardware stores. Do not use a humidifier as can make the problem worse and are not recommended.  Control of Cockroach Allergen  Cockroach allergen has been identified as an important cause of acute attacks of asthma, especially in urban settings.  There are fifty-five species of cockroach that exist in the [place], however only three, the [place], [place] species produce allergen that can affect patients with Asthma.  Allergens can be obtained from fecal particles, egg casings and secretions from cockroaches.    Remove food sources. Reduce access to water. Seal access and entry points. Spray runways with 0.5-1% Diazinon or Chlorpyrifos Blow boric acid power under stoves and  refrigerator. Place bait stations (hydramethylnon) at feeding sites.

## 2021-04-30 ENCOUNTER — Encounter: Payer: Self-pay | Admitting: Family Medicine

## 2021-04-30 MED ORDER — CETIRIZINE HCL 10 MG PO TABS: ORAL_TABLET | ORAL | 5 refills | Status: AC

## 2021-04-30 MED ORDER — FAMOTIDINE 20 MG PO TABS: 20.0000 mg | ORAL_TABLET | Freq: Two times a day (BID) | ORAL | 5 refills | Status: AC

## 2021-04-30 MED ORDER — ALBUTEROL SULFATE HFA 108 (90 BASE) MCG/ACT IN AERS: 2.0000 | INHALATION_SPRAY | Freq: Four times a day (QID) | RESPIRATORY_TRACT | 1 refills | Status: DC | PRN

## 2021-05-03 LAB — ALLERGEN PROFILE, VEGETABLE II
Allergen Carrot IgE: <0.1 kU/L
Allergen Green Bean IgE: <0.1 kU/L
Allergen Green Pea IgE: <0.1 kU/L
Allergen Onion IgE: <0.1 kU/L
Allergen Potato, White IgE: <0.1 kU/L
Allergen Tomato, IgE: <0.1 kU/L
Kidney Bean IgE: <0.1 kU/L
Pumpkin IgE: 0.11 kU/L — AB
Soybean IgE: <0.1 kU/L

## 2021-05-04 ENCOUNTER — Telehealth: Payer: Self-pay | Admitting: Allergy & Immunology

## 2021-05-04 LAB — ALPHA-GAL PANEL
Allergen Lamb IgE: 0.51 kU/L — AB
Beef IgE: 0.6 kU/L — AB
IgE (Immunoglobulin E), Serum: 287 IU/mL (ref 6–495)
O215-IgE Alpha-Gal: 2.09 kU/L — AB
Pork IgE: 0.53 kU/L — AB

## 2021-05-04 LAB — ALLERGEN, PEANUT F13: Peanut IgE: <0.1 kU/L

## 2021-05-04 LAB — ALLERGEN MILK: Milk IgE: 0.27 kU/L — AB

## 2021-05-04 LAB — ALLERGEN PROFILE, FOOD-FISH
Allergen Mackerel IgE: <0.1 kU/L
Allergen Salmon IgE: <0.1 kU/L
Allergen Trout IgE: <0.1 kU/L
Allergen Walley Pike IgE: <0.1 kU/L
Codfish IgE: <0.1 kU/L
Halibut IgE: <0.1 kU/L
Tuna: <0.1 kU/L

## 2021-05-04 LAB — ALLERGEN PROFILE, SHELLFISH
Clam IgE: <0.1 kU/L
F023-IgE Crab: <0.1 kU/L
F080-IgE Lobster: <0.1 kU/L
F290-IgE Oyster: <0.1 kU/L
Scallop IgE: <0.1 kU/L
Shrimp IgE: <0.1 kU/L

## 2021-05-04 LAB — F245-IGE EGG, WHOLE: Egg, Whole IgE: <0.1 kU/L

## 2021-05-04 LAB — ALLERGEN PROFILE, FOOD-FRUIT
Allergen Apple, IgE: <0.1 kU/L
Allergen Banana IgE: <0.1 kU/L
Allergen Grape IgE: <0.1 kU/L
Allergen Pear IgE: <0.1 kU/L
Allergen, Peach f95: <0.1 kU/L

## 2021-05-04 LAB — ALLERGEN PANEL, FOOD-BERRY
Allergen Blueberry IgE: <0.1 kU/L
Allergen Strawberry IgE: <0.1 kU/L
F343-IgE Raspberry: <0.1 kU/L

## 2021-05-04 LAB — ALLERGEN CHOCOLATE: Chocolate/Cacao IgE: <0.1 kU/L

## 2021-05-04 MED ORDER — PREDNISONE 10 MG PO TABS: ORAL_TABLET | ORAL | 1 refills | Status: DC

## 2021-05-04 NOTE — Telephone Encounter (Signed)
Patient called reporting that she stopped her prednisone yesterday and broke out in hives again today.  She is requesting more prednisone.  Per the conversation with the patient, the plan is to skin test her if her blood work is unrevealing.  She has been off of her Xolair for 3 months for repeat skin testing.  She saw Thurston Hole one of our nurse practitioners last week and it was decided to do blood work because she was covered head to toe and hives.  Therefore, I sent in another week or so prednisone.  This will give time for the labs to come back and repeat skin testing to happen.  Rounding to Thurston Hole to keep her in the loop.  Malachi Bonds, MD [facility]

## 2021-05-05 NOTE — Progress Notes (Signed)
Can you please let this patient know that she did have some testing that is positive including alpha gal, milk, and pumpkin. Please have her avoid all mammalian products including milk and milk products like yogurt and cheese.  Pumpkin was sightly positive and she should avoid pumpkin at this time. If she continues to experience hives while avoiding these foods, we can restart Xolair. Thank you

## 2021-05-05 NOTE — Telephone Encounter (Signed)
Thank you :)

## 2021-05-06 ENCOUNTER — Telehealth: Payer: Self-pay | Admitting: Family Medicine

## 2021-05-06 NOTE — Telephone Encounter (Signed)
Please advise to plant based foods

## 2021-05-06 NOTE — Telephone Encounter (Signed)
Patient was calling to see what you though of [facility] foods and cheeses.she wanted to know if she should try it or not. [phone].

## 2021-05-06 NOTE — Telephone Encounter (Signed)
Plant based foods should be fine. Please have her keep a journal of foods she consumes and how her hives are controlled. Thank you

## 2021-05-07 NOTE — Telephone Encounter (Signed)
Informed pt of this and she will keep a journal and give [place] an update at next follow up

## 2021-05-11 NOTE — Telephone Encounter (Signed)
Patient called and said that she needs prednisone now because she is itching and her lips are tingle. She finish the ones you called in. And has stop dairy products  . But she is still breaking out. [facility]. [phone].

## 2021-05-17 DIAGNOSIS — Z6841 Body Mass Index (BMI) 40.0 and over, adult: Secondary | ICD-10-CM | POA: Diagnosis not present

## 2021-05-17 DIAGNOSIS — Z01419 Encounter for gynecological examination (general) (routine) without abnormal findings: Secondary | ICD-10-CM | POA: Diagnosis not present

## 2021-05-17 DIAGNOSIS — Z1231 Encounter for screening mammogram for malignant neoplasm of breast: Secondary | ICD-10-CM | POA: Diagnosis not present

## 2021-05-17 NOTE — Telephone Encounter (Signed)
Please order Prednisone 10 mg tablets. Take 2 tablets once a day for 4 days, then take 1 tablet on the 5th day, then stop.  Please make sure that she is at the top of the antihistamine ladder.  I am sending information regarding side effects of prednisone.  Please remind her that prednisone is not a long-term solution and we need to find an alternative solution.  Please remind her that we are happy to restart Xolair injections.

## 2021-05-17 NOTE — Telephone Encounter (Signed)
Spoke with patient, I apologized that we are just now getting back to her regarding this matter. Unfortunately this message was sent to [facility] when she was out of the office. Patient reached out to her PCP to get prednisone since she didn't hear back from [place] and is no longer needing it. Patient stated that she believes she has pinpointed the reason behind her hives. She stated that she believes it is her bed sheets, her bed sheets consist of cotton and microfiber polyester. She has stopped using them and has ordered new ones. She will call if she has any problems.

## 2021-06-01 IMAGING — CT CT HEAD W/O CM
4 series · 16 of 47 positions shown, 18 images · non-contrast
Comparison: CT 06/07/2015

CLINICAL DATA: Lightheadedness, ran out of blood pressure
medication for the past 2 weeks, hypertensive

EXAM:
CT HEAD WITHOUT CONTRAST
TECHNIQUE: Contiguous axial images were obtained from the base of the skull
through the vertex without intravenous contrast.

[Series 3: head wo · axial · 0.41mm/px · z∈[-183,-68]mm · 7 of 31 slices shown, 9 images]
[im 4/31  brain]
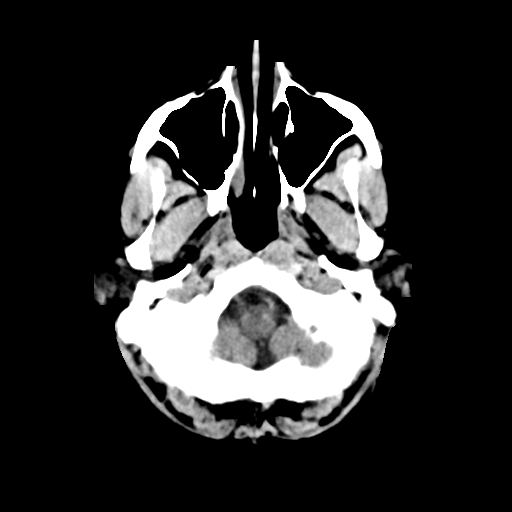
[im 4/31  bone]
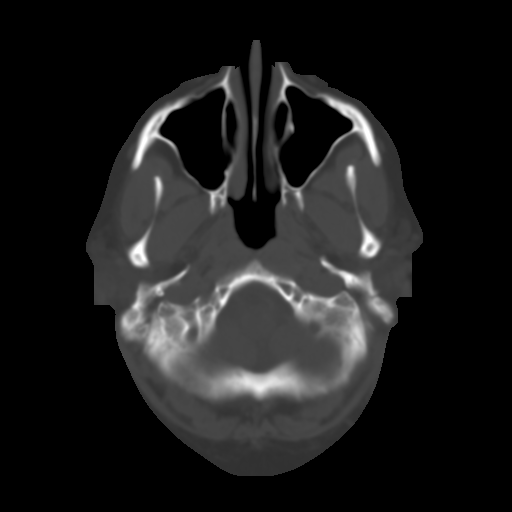
[im 8/31  brain]
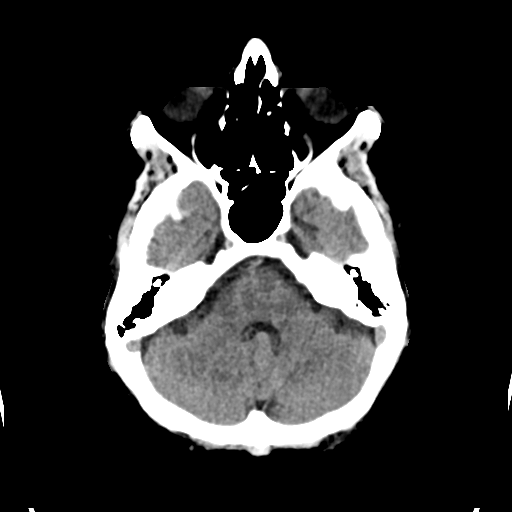
[im 12/31  brain]
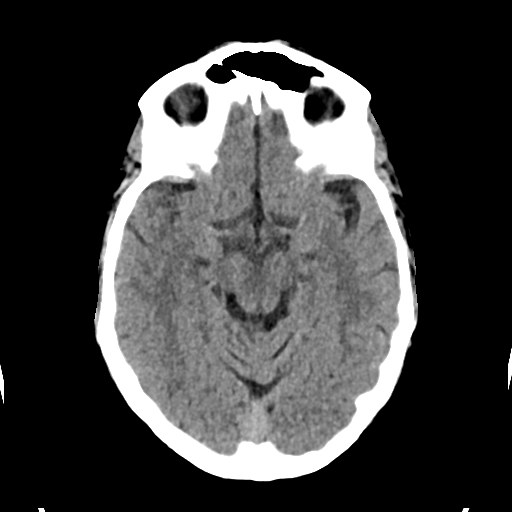
[im 16/31  brain]
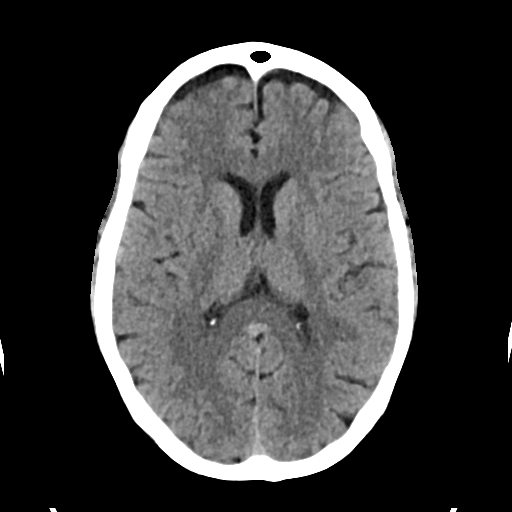
[im 19/31  brain]
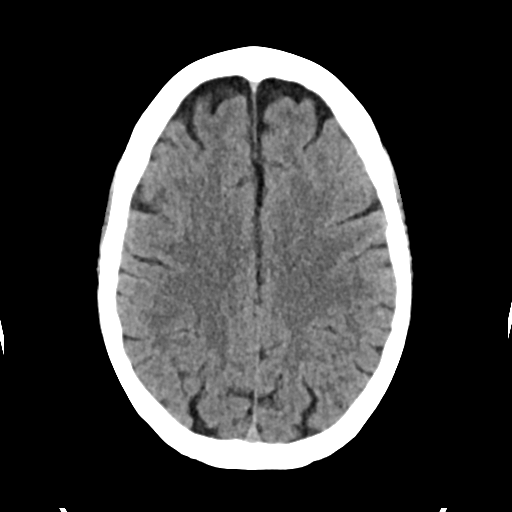
[im 19/31  bone]
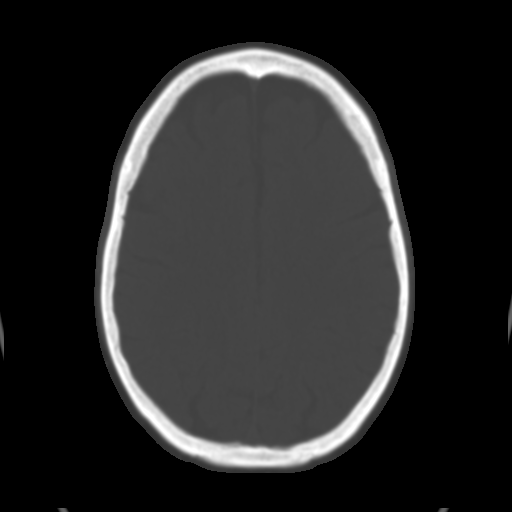
[im 23/31  brain]
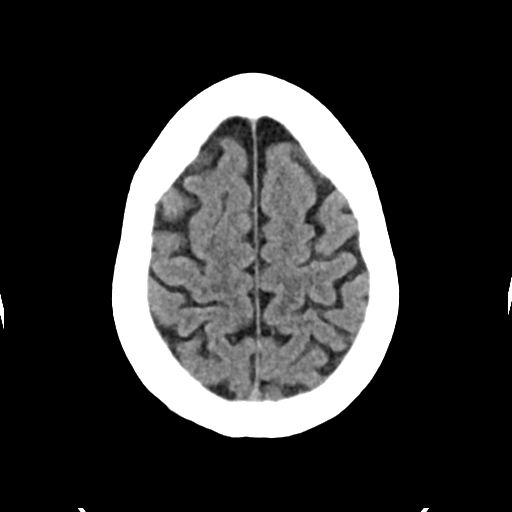
[im 27/31  brain]
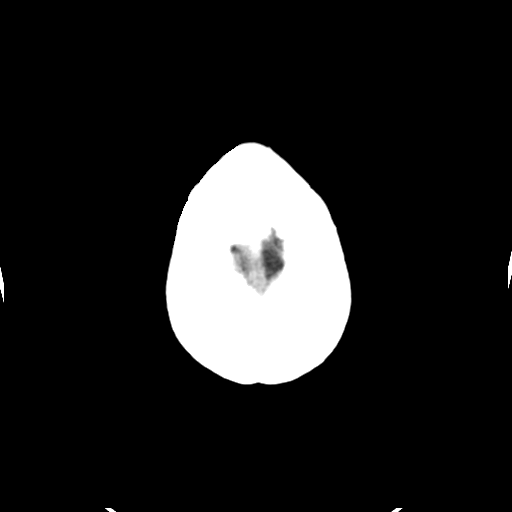

[Series 4: head bone · axial · 0.41mm/px · z∈[-184,-152]mm · 3 of 78 slices shown]
[im 8/78  bone]
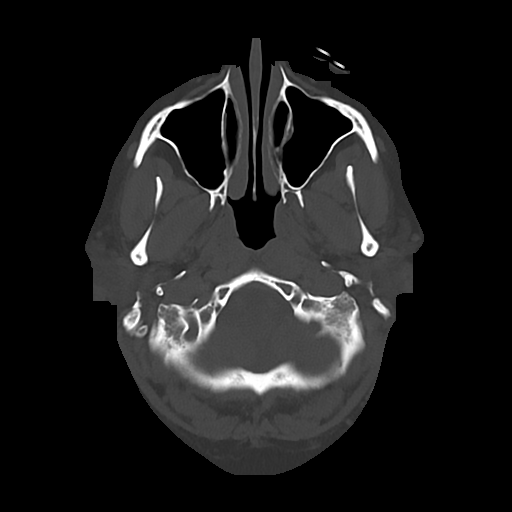
[im 16/78  bone]
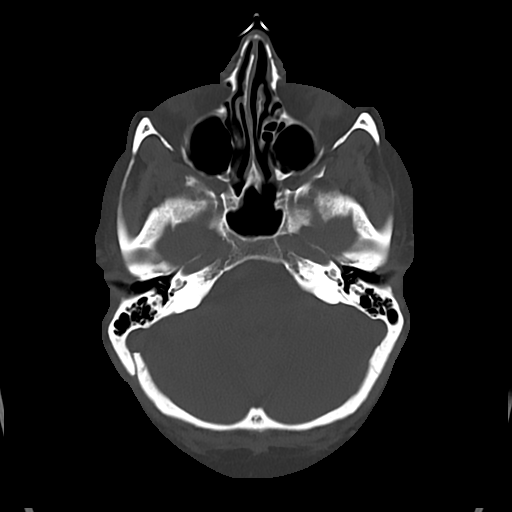
[im 24/78  bone]
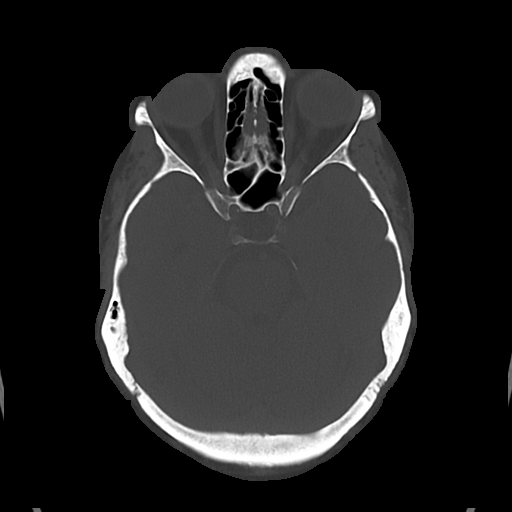

[Series 5: cor soft · coronal · 0.28mm/px · 3 of 68 slices shown]
[im 23/68  brain]
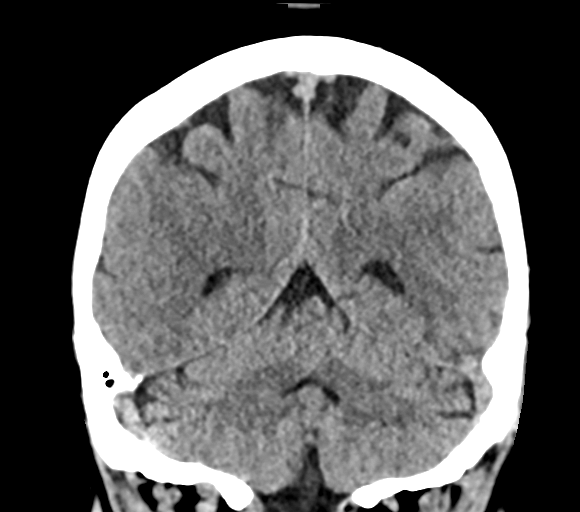
[im 30/68  brain]
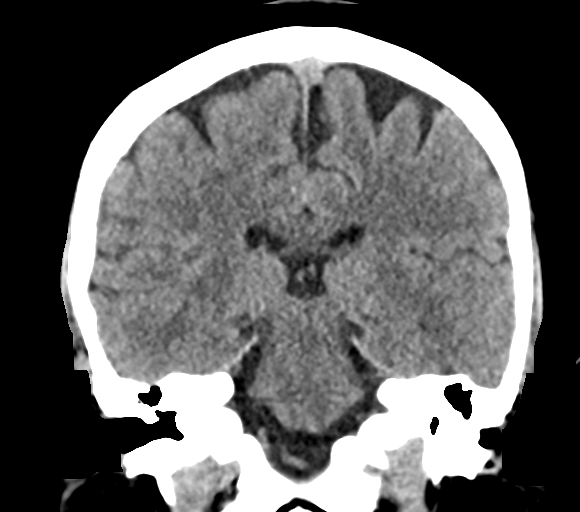
[im 38/68  brain]
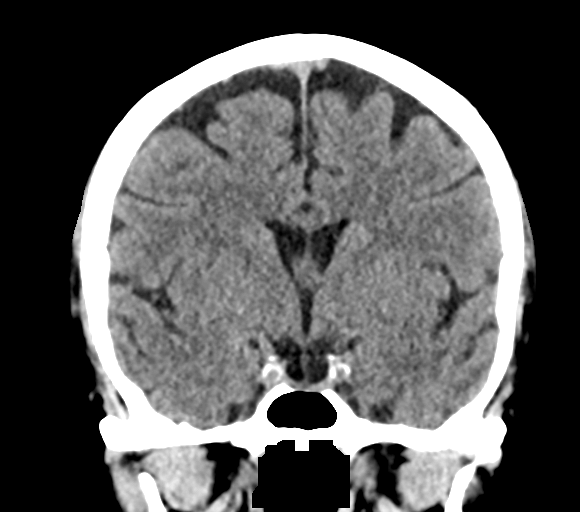

[Series 6: sag soft · sagittal · 0.29mm/px · 3 of 55 slices shown]
[im 19/55  brain]
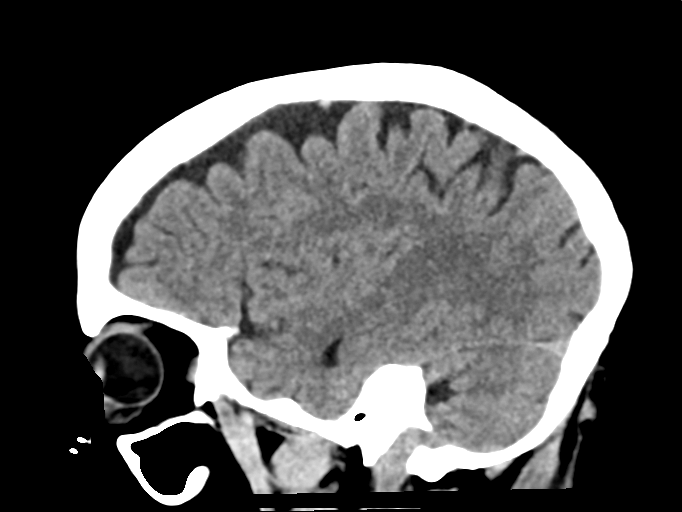
[im 28/55  brain]
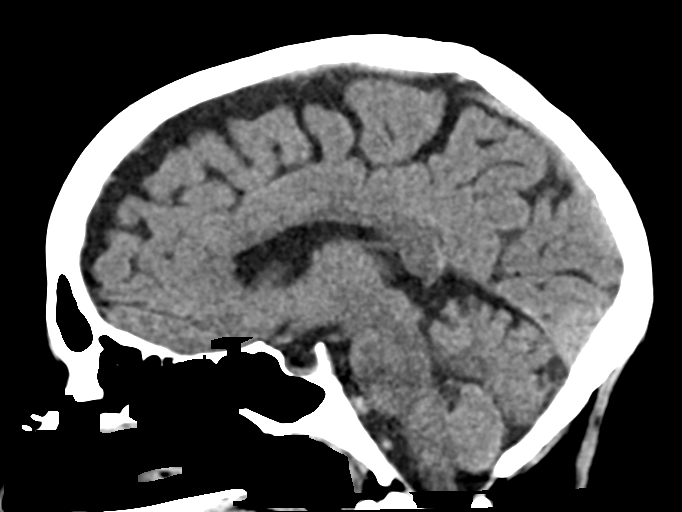
[im 37/55  brain]
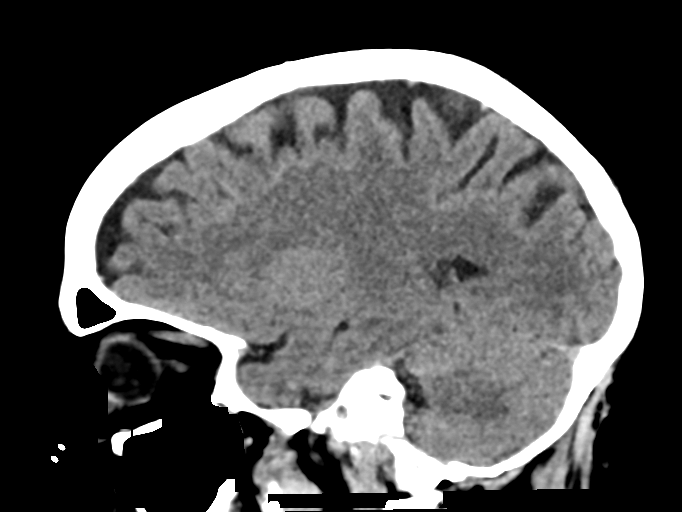

[16 of 47 positions shown; findings below may reference images not displayed]

FINDINGS: Brain: No evidence of acute infarction, hemorrhage, hydrocephalus,
extra-axial collection, visible mass lesion or mass effect.
Symmetric prominence of the ventricles, cisterns and sulci
compatible with some mildly age advanced parenchymal volume loss.

Vascular: Atherosclerotic calcification of the carotid siphons. No
hyperdense vessel.

Skull: No calvarial fracture or suspicious osseous lesion. No scalp
swelling or hematoma.

Sinuses/Orbits: Paranasal sinuses and mastoid air cells are
predominantly clear. Included orbital structures are unremarkable.

Other: Few periapical lucencies noted about the included portions of
the maxillary dentition.
IMPRESSION: 1. No acute intracranial findings.
2. Mildly age advanced parenchymal volume loss.
3. Few periapical lucencies about the included portions of the
maxillary dentition. Correlate with dental exam.

## 2021-06-02 ENCOUNTER — Ambulatory Visit: Payer: BC Managed Care – PPO | Admitting: Family Medicine

## 2021-08-30 DIAGNOSIS — J45909 Unspecified asthma, uncomplicated: Secondary | ICD-10-CM | POA: Diagnosis not present

## 2021-09-03 ENCOUNTER — Other Ambulatory Visit: Payer: Self-pay | Admitting: Nurse Practitioner

## 2021-09-03 ENCOUNTER — Ambulatory Visit
Admission: RE | Admit: 2021-09-03 | Discharge: 2021-09-03 | Disposition: A | Discharge status: Home / Self Care | Payer: BC Managed Care – PPO | Source: Ambulatory Visit | Attending: Nurse Practitioner | Admitting: Nurse Practitioner

## 2021-09-03 DIAGNOSIS — R059 Cough, unspecified: Secondary | ICD-10-CM | POA: Diagnosis not present

## 2021-09-03 DIAGNOSIS — J45909 Unspecified asthma, uncomplicated: Secondary | ICD-10-CM

## 2021-09-08 DIAGNOSIS — R0602 Shortness of breath: Secondary | ICD-10-CM | POA: Diagnosis not present

## 2021-09-08 DIAGNOSIS — J441 Chronic obstructive pulmonary disease with (acute) exacerbation: Secondary | ICD-10-CM | POA: Diagnosis not present

## 2021-10-12 ENCOUNTER — Other Ambulatory Visit: Payer: Self-pay | Admitting: Family Medicine

## 2021-10-18 ENCOUNTER — Institutional Professional Consult (permissible substitution): Payer: BC Managed Care – PPO | Admitting: Pulmonary Disease

## 2021-11-02 ENCOUNTER — Institutional Professional Consult (permissible substitution): Payer: BC Managed Care – PPO | Admitting: Pulmonary Disease

## 2021-11-22 ENCOUNTER — Telehealth: Payer: Self-pay | Admitting: Family Medicine

## 2021-11-22 NOTE — Telephone Encounter (Signed)
Patient called in and states she needs an expense report printed and mailed out to her for the entire year of 2022.  For clarification, I asked her if this was everything she paid in 2022 and she stated yes and wanted this mailed to her home.  I verified the address on file is correct.

## 2021-12-14 DIAGNOSIS — Z114 Encounter for screening for human immunodeficiency virus [HIV]: Secondary | ICD-10-CM | POA: Diagnosis not present

## 2021-12-14 DIAGNOSIS — Z1159 Encounter for screening for other viral diseases: Secondary | ICD-10-CM | POA: Diagnosis not present

## 2021-12-14 DIAGNOSIS — E039 Hypothyroidism, unspecified: Secondary | ICD-10-CM | POA: Diagnosis not present

## 2021-12-14 DIAGNOSIS — M79609 Pain in unspecified limb: Secondary | ICD-10-CM | POA: Diagnosis not present

## 2021-12-14 DIAGNOSIS — R0602 Shortness of breath: Secondary | ICD-10-CM | POA: Diagnosis not present

## 2021-12-14 DIAGNOSIS — R600 Localized edema: Secondary | ICD-10-CM | POA: Diagnosis not present

## 2021-12-14 DIAGNOSIS — I10 Essential (primary) hypertension: Secondary | ICD-10-CM | POA: Diagnosis not present

## 2021-12-14 DIAGNOSIS — E669 Obesity, unspecified: Secondary | ICD-10-CM | POA: Diagnosis not present

## 2021-12-29 DIAGNOSIS — J45909 Unspecified asthma, uncomplicated: Secondary | ICD-10-CM | POA: Diagnosis not present

## 2022-01-26 ENCOUNTER — Ambulatory Visit: Payer: BC Managed Care – PPO | Admitting: Podiatry

## 2022-01-28 ENCOUNTER — Encounter: Payer: Self-pay | Admitting: Podiatry

## 2022-01-28 ENCOUNTER — Ambulatory Visit (INDEPENDENT_AMBULATORY_CARE_PROVIDER_SITE_OTHER): Payer: BC Managed Care – PPO

## 2022-01-28 ENCOUNTER — Telehealth: Payer: Self-pay | Admitting: Podiatry

## 2022-01-28 ENCOUNTER — Ambulatory Visit (INDEPENDENT_AMBULATORY_CARE_PROVIDER_SITE_OTHER): Payer: BC Managed Care – PPO | Admitting: Podiatry

## 2022-01-28 DIAGNOSIS — M778 Other enthesopathies, not elsewhere classified: Secondary | ICD-10-CM

## 2022-01-28 DIAGNOSIS — M792 Neuralgia and neuritis, unspecified: Secondary | ICD-10-CM

## 2022-01-28 DIAGNOSIS — M79672 Pain in left foot: Secondary | ICD-10-CM | POA: Diagnosis not present

## 2022-01-28 DIAGNOSIS — M79671 Pain in right foot: Secondary | ICD-10-CM

## 2022-01-28 DIAGNOSIS — M7732 Calcaneal spur, left foot: Secondary | ICD-10-CM | POA: Diagnosis not present

## 2022-01-28 DIAGNOSIS — M19072 Primary osteoarthritis, left ankle and foot: Secondary | ICD-10-CM | POA: Diagnosis not present

## 2022-01-28 MED ORDER — METHYLPREDNISOLONE 4 MG PO TBPK: ORAL_TABLET | ORAL | 0 refills | Status: DC

## 2022-01-28 NOTE — Telephone Encounter (Signed)
Notified pt and she said thank you so much. ?

## 2022-01-28 NOTE — Telephone Encounter (Signed)
For now she can stick to the bedroom shoes until swelling improves. Generally I like both the brooks and on clouds she just needs to stick with a wide width. Hokas and new balance are also good brands.

## 2022-01-28 NOTE — Telephone Encounter (Signed)
Pt called and forgot to ask you at her appt what shoes do you recommend for her to wear. She has brooks that seem to be too narrow even thought she went a size up, She has a pair of on clouds and they do not help. She is one to stay at home a lot and wears diabetic style bedroom shoes but what would you recommend for her? ?

## 2022-01-28 NOTE — Progress Notes (Signed)
?  Subjective:  ?Patient ID: Mallory Silva, female    DOB: 1965-12-31,   MRN: [id number] ? ?Chief Complaint  ?Patient presents with  ? Foot Pain  ?  Dorsal forefoot/midfoot and lateral side bilateral (R>L) - swelling, aching since Jan. 2023, goes back and forth between feet, some days gets up and can't bear weight, no injury, reaction to alpha-gal so limited to meds she can take (can take prednisone), PCP eval-referred for treatment  ? New Patient (Initial Visit)  ? ? ?56 y.o. female presents for concern of bilateral feet swelling. Relates for a few months she has been getting aching on the dorsal foot and lateral sides of the feet more so on the right foot. Relates the pain and swelling comes and goes ut when there she can't bear weight. Relates pain does radiate around the outside of the foot.  Denies any injury. Usually she can only tolerate prednisone. Relates her PCP told her she did not have gout. She does have history of lumbar post-laminectomy syndrome and chronic pain . Denies any other pedal complaints. Denies n/v/f/c.  ? ?Past Medical History:  ?Diagnosis Date  ? Anxiety   ? Asthma   ? Chronic pain syndrome   ? Depression   ? Lumbar post-laminectomy syndrome   ? Urticaria   ? ? ?Objective:  ?Physical Exam: ?Vascular: DP/PT pulses 2/4 bilateral. CFT <3 seconds. Normal hair growth on digits.  Mild edema to bilateral lower extremities and dorsal feet.  ?Skin. No lacerations or abrasions bilateral feet.  ?Musculoskeletal: MMT 5/5 bilateral lower extremities in DF, PF, Inversion and Eversion. Deceased ROM in DF of ankle joint. Tender over second metatarsal and TMTJ on right and tenderness more laterally around third metatarsal on left. Negative tinels. No real tenderness in other areas but does relates numbness and pain that spread along the top and lateral part of the foot.  ?Neurological: Sensation intact to light touch.  ? ?Assessment:  ? ?1. Neuritis   ?2. Capsulitis of left foot   ?3. Capsulitis of foot,  right   ? ? ? ?Plan:  ?Patient was evaluated and treated and all questions answered. ?-Xrays reviewed. No acute fractures or dislocations, some mild degenerative changes in the midfoot.  ?-Discussed possible causes of pain including neuritis vs radiculopathy from previous back surgery vs gout vs capsulitis.  ?-Discussed treatement options for gouty arthritis and gout education provided. ?-Medrol dose pack provided.  ?-Ordered arthritic lab panel; will call patient with results if abnormal ?-Advised patient to call if symptoms are not improved within 1 week ?-Patient to return in 4 weeks for re-check/further discussion for long term management of gout or sooner if condition worsens. ? ? ?Louann Sjogren, DPM  ? ? ?

## 2022-01-31 ENCOUNTER — Ambulatory Visit: Payer: BC Managed Care – PPO | Admitting: Podiatry

## 2022-02-09 ENCOUNTER — Telehealth: Payer: Self-pay | Admitting: *Deleted

## 2022-02-09 NOTE — Telephone Encounter (Signed)
Patient is calling for xray results, before her upcoming appointment in June. Please advise. ?

## 2022-02-28 ENCOUNTER — Ambulatory Visit: Payer: BC Managed Care – PPO | Admitting: Podiatry

## 2022-03-09 ENCOUNTER — Ambulatory Visit: Payer: BC Managed Care – PPO | Admitting: Podiatry

## 2022-03-10 ENCOUNTER — Telehealth: Payer: Self-pay | Admitting: Podiatry

## 2022-03-10 NOTE — Telephone Encounter (Signed)
Faxed order to [facility], confirmation received 03/10/22

## 2022-03-10 NOTE — Telephone Encounter (Signed)
Patient is calling because she is scheduled to see Dr. Ralene Cork on 03/16/2022. She states that Dr. Ralene Cork would like her to have some labs drawn prior to her appointment and her PCP is willing to do the lab draws but they need the order as to what labs. The order can be fax to [phone] and the name of the practice is [facility]

## 2022-03-14 DIAGNOSIS — M778 Other enthesopathies, not elsewhere classified: Secondary | ICD-10-CM | POA: Diagnosis not present

## 2022-03-14 DIAGNOSIS — M792 Neuralgia and neuritis, unspecified: Secondary | ICD-10-CM | POA: Diagnosis not present

## 2022-03-16 ENCOUNTER — Ambulatory Visit (INDEPENDENT_AMBULATORY_CARE_PROVIDER_SITE_OTHER): Payer: BC Managed Care – PPO | Admitting: Podiatry

## 2022-03-16 ENCOUNTER — Encounter: Payer: Self-pay | Admitting: Podiatry

## 2022-03-16 ENCOUNTER — Telehealth: Payer: Self-pay | Admitting: *Deleted

## 2022-03-16 DIAGNOSIS — M5416 Radiculopathy, lumbar region: Secondary | ICD-10-CM | POA: Diagnosis not present

## 2022-03-16 DIAGNOSIS — M792 Neuralgia and neuritis, unspecified: Secondary | ICD-10-CM

## 2022-03-16 MED ORDER — METHYLPREDNISOLONE 4 MG PO TBPK: ORAL_TABLET | ORAL | 0 refills | Status: DC

## 2022-03-16 NOTE — Progress Notes (Signed)
  Subjective:  Patient ID: Mallory Silva, female    DOB: Jul 22, 1966,   MRN: [id number]  Chief Complaint  Patient presents with   Foot Pain     3 week f/u    56 y.o. female presents for follow-up of bilateral foot pain. Relates she had arthritic panel done at family practice and results faxed over. States medrol dose pack helps but pain just returns after and does not have much of a difference. Relates it comes and goes and no current pain in foot right now.  . Denies any other pedal complaints. Denies n/v/f/c.   Past Medical History:  Diagnosis Date   Anxiety    Asthma    Chronic pain syndrome    Depression    Lumbar post-laminectomy syndrome    Urticaria     Objective:  Physical Exam: Vascular: DP/PT pulses 2/4 bilateral. CFT <3 seconds. Normal hair growth on digits.  Mild edema to bilateral lower extremities and dorsal feet.  Skin. No lacerations or abrasions bilateral feet.  Musculoskeletal: MMT 5/5 bilateral lower extremities in DF, PF, Inversion and Eversion. Deceased ROM in DF of ankle joint. No real areas of tenderness today. Negative tinels. No real tenderness in other areas but does relates numbness and pain that spread along the top and lateral part of the foot.  Neurological: Sensation intact to light touch.   Assessment:   1. Neuritis   2. Lumbar radiculopathy       Plan:  Patient was evaluated and treated and all questions answered. -Xrays reviewed. No acute fractures or dislocations, some mild degenerative changes in the midfoot.  -Discussed possible causes of pain including neuritis vs radiculopathy from previous back surgery vs gout vs capsulitis.  -Discussed treatment radiculopathy and advised to follow-up with back specialist as likely in the absence of any inflammatory arthritis this is probably coming from back.  -Medrol dose pack provided.  -Arthritic panel negative although urica acid not done however at this point feel like likely not gout related.   -Patient to return as needed.    Louann Sjogren, DPM

## 2022-04-05 IMAGING — CR DG CHEST 2V
3 series · 3 of 3 positions shown · non-contrast
Comparison: 06/07/2015

CLINICAL DATA: Cough for 2 weeks

EXAM:
CHEST - 2 VIEW

[w chest pa (1 of 2)]
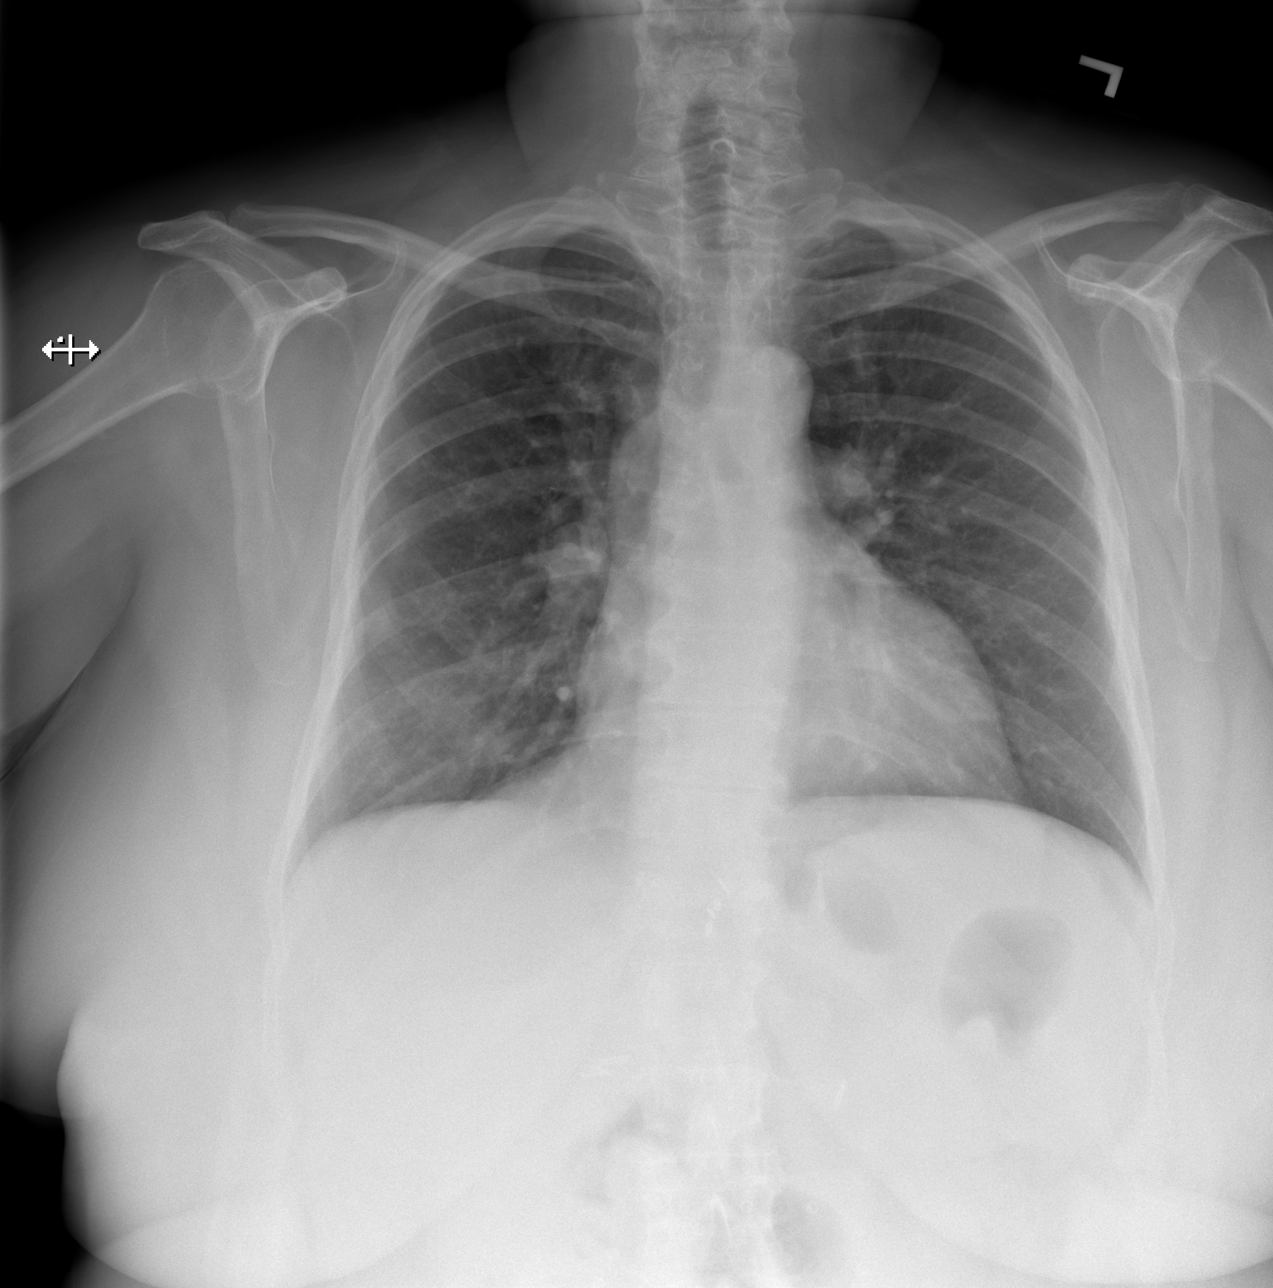

[w chest pa (2 of 2)]
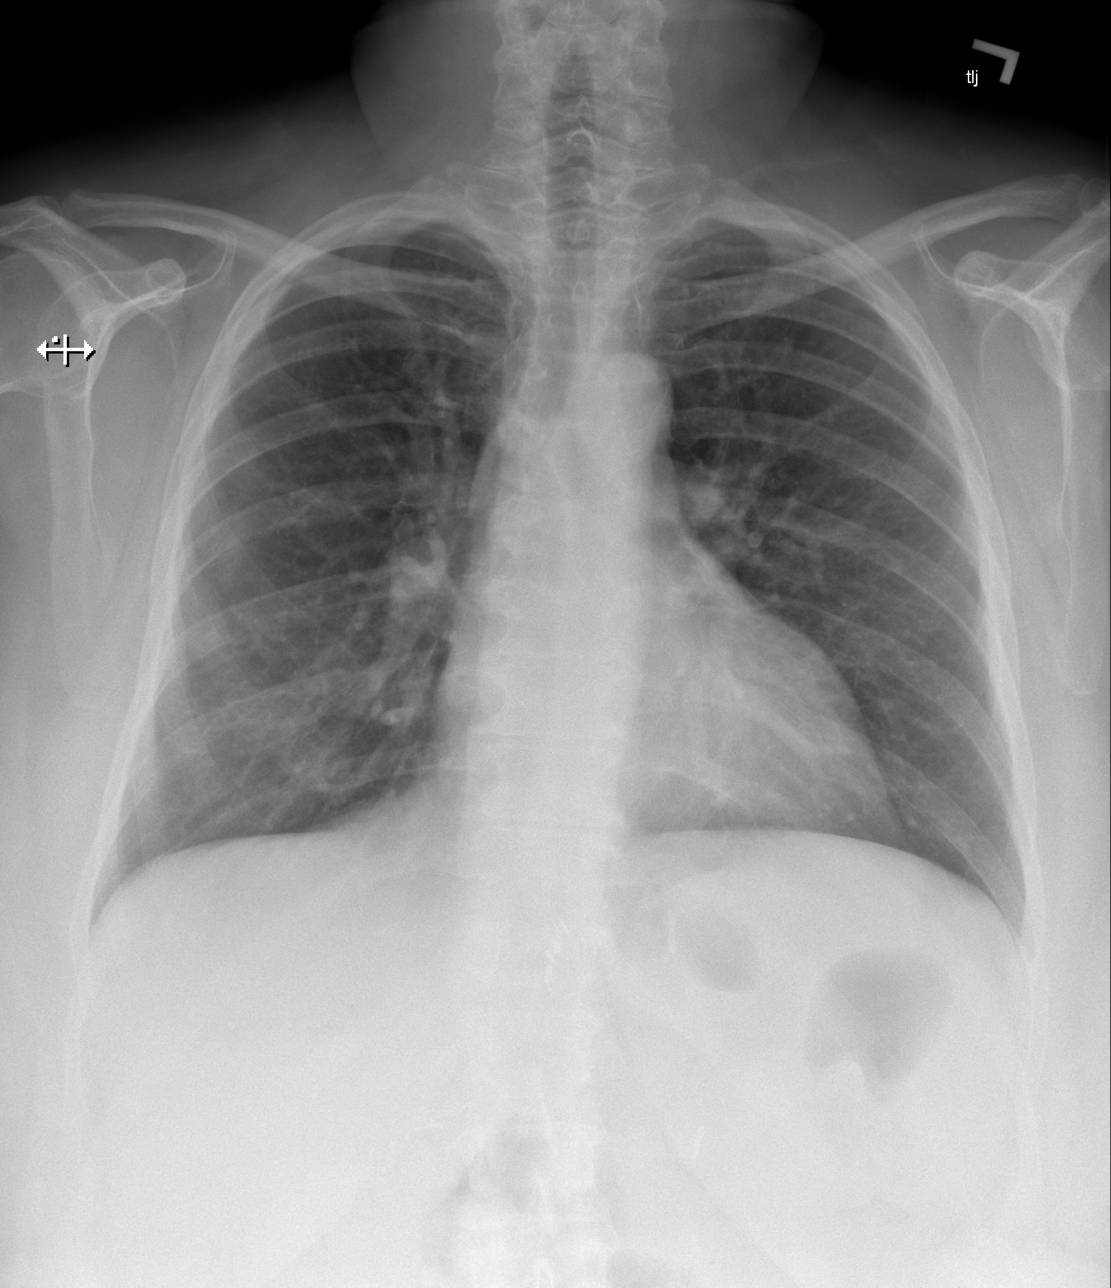

[w chest lat]
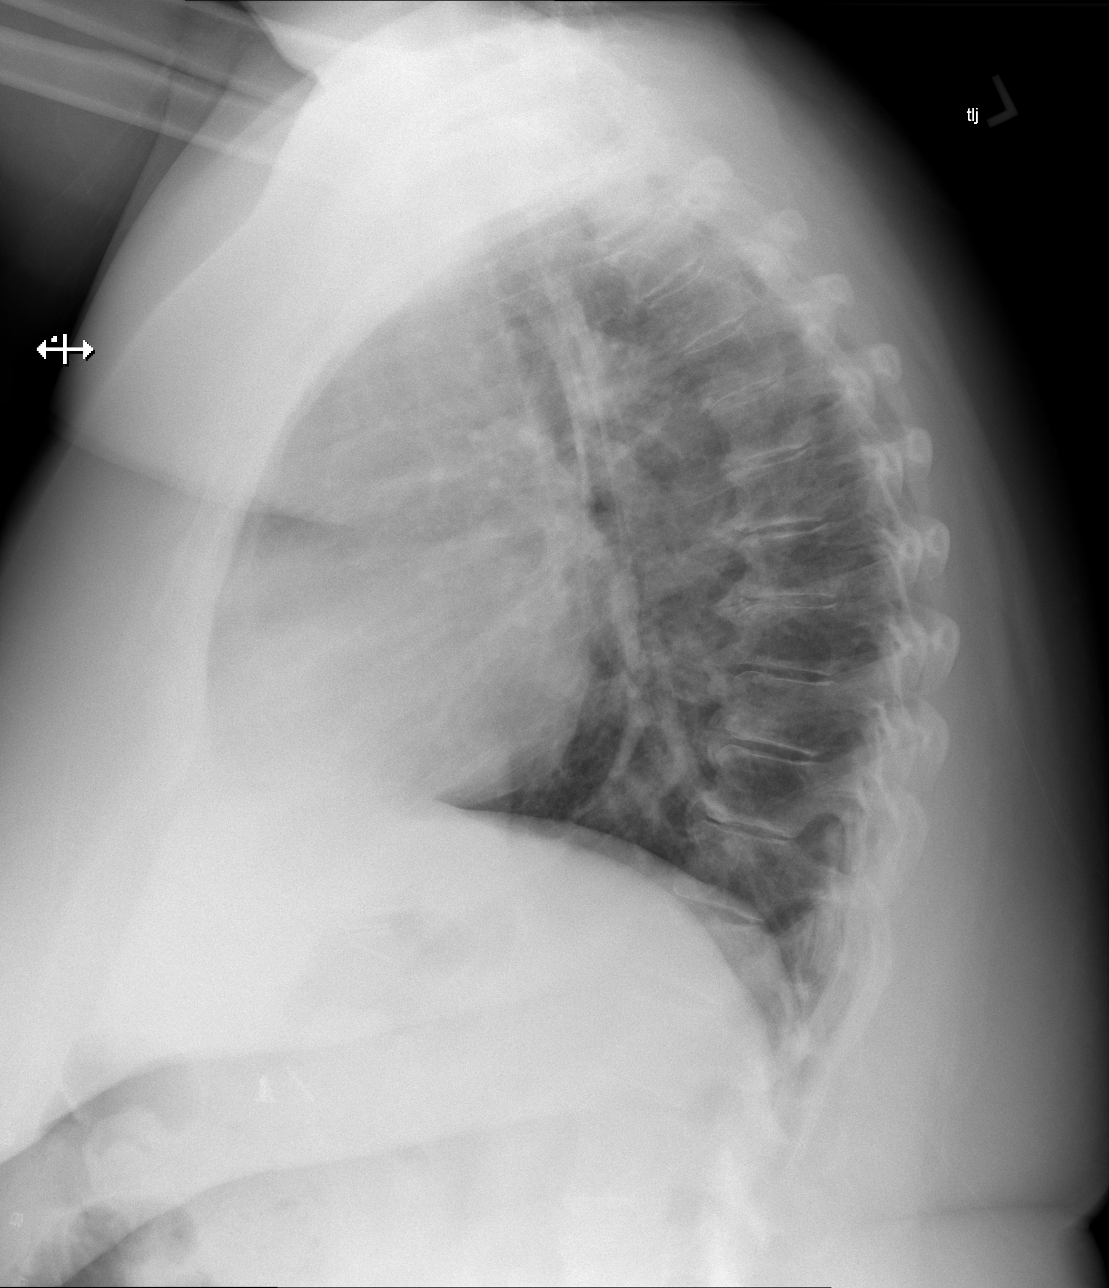

[3 of 3 positions shown; findings below may reference images not displayed]

FINDINGS: The heart size and mediastinal contours are within normal limits.
Both lungs are clear. Degenerative changes of the spine
IMPRESSION: No active cardiopulmonary disease.

## 2022-05-12 ENCOUNTER — Ambulatory Visit: Payer: BC Managed Care – PPO | Admitting: Allergy & Immunology

## 2022-05-24 ENCOUNTER — Encounter: Payer: Self-pay | Admitting: Allergy & Immunology

## 2022-05-24 ENCOUNTER — Ambulatory Visit (INDEPENDENT_AMBULATORY_CARE_PROVIDER_SITE_OTHER): Payer: BC Managed Care – PPO | Admitting: Allergy & Immunology

## 2022-05-24 VITALS — BP 142/80 | HR 95 | Temp 98.2°F | Resp 12 | Wt 201.0 lb

## 2022-05-24 DIAGNOSIS — L500 Allergic urticaria: Secondary | ICD-10-CM

## 2022-05-24 DIAGNOSIS — T7800XD Anaphylactic reaction due to unspecified food, subsequent encounter: Secondary | ICD-10-CM | POA: Diagnosis not present

## 2022-05-24 DIAGNOSIS — J4540 Moderate persistent asthma, uncomplicated: Secondary | ICD-10-CM

## 2022-05-24 MED ORDER — EPINEPHRINE 0.3 MG/0.3ML IJ SOAJ: 0.3000 mg | INTRAMUSCULAR | 1 refills | Status: DC | PRN

## 2022-05-24 MED ORDER — TRELEGY ELLIPTA 200-62.5-25 MCG/ACT IN AEPB
1.0000 | INHALATION_SPRAY | Freq: Every day | RESPIRATORY_TRACT | 5 refills | Status: DC
Start: 2022-05-24 — End: 2023-06-21

## 2022-05-24 NOTE — Progress Notes (Signed)
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 - We are not going to make any changes since the Trelegy is working for you.  - Daily controller medication(s): NOTHING - Prior to physical activity: albuterol 2 puffs 10-15 minutes before physical activity. - Rescue medications: albuterol 4 puffs every 4-6 hours as needed - Changes during respiratory infections or worsening symptoms: Add on Trelegy 200/62.5/25 one puff once daily for 1-2 weeks - Asthma control goals:  * Full participation in all desired activities (may need albuterol before activity) * Albuterol use two time or less a week on average (not counting use with activity) * Cough interfering with sleep two time or less a month * Oral steroids no more than once a year * No hospitalizations  2. Hives (urticaria) - At the first sign of any hives, start your regimen of antihistamines (titrating while remaining hive free):  Cetirizine (Zyrtec) 10mg  twice a day and famotidine (Pepcid) 20 mg twice a day. If no symptoms for 7-14 days then decrease to. Cetirizine (Zyrtec) 10mg  twice a day and famotidine (Pepcid) 20 mg once a day.  If no symptoms for 7-14 days then decrease to. Cetirizine (Zyrtec) 10mg  twice a day.  If no symptoms for 7-14 days then decrease to. Cetirizine (Zyrtec) 10mg  once a day.  3. Allergic rhinitis - Continue cetirizine 10 mg once a day as needed for runny nose or itch - Consider saline nasal rinses as needed for nasal  symptoms. Use this before any medicated nasal sprays for best result - Continue allergen avoidance measures directed toward pollen, cockroach, dust mite, and pets   4. Alpha gal allergy - We are getting repeat labs today. - We will call you in 1-2 weeks with the results of the testing.  - Epinephrine autoinjector refilled today.   5. Reflux - Continue dietary and lifestyle modifications as listed below - Continue Pepcid daily for reflux control  6. Return in about 1 year (around 05/25/2023).   Subjective:   Mallory Silva is a 56 y.o. female presenting today for follow up of  Chief Complaint  Patient presents with   Allergy Testing    Alpha gal retest, dairy   Asthma    Been doing good.     Mallory Silva has a history of the following: Patient Active Problem List   Diagnosis Date Noted   Throat swelling 01/27/2021   Gastroesophageal reflux disease 01/27/2021   Allergic reaction to alpha-gal 01/27/2021   Anaphylactic shock due to adverse food reaction 05/17/2018   Seasonal and perennial allergic rhinitis 05/17/2018   Chronic urticaria 08/28/2017   Mild intermittent asthma without complication 08/28/2017   Lumbar radiculopathy 01/03/2012   Lumbar post-laminectomy syndrome 01/03/2012    History obtained from: chart review and patient.   Mallory Silva is a 56 y.o. female presenting for a follow up visit.  She was last seen in August 2022 by 03/04/2012 one of our nurse practitioners.  At that time, she had testing that was positive to alpha gal, milk, and pumpkin.  She was instructed  to avoid all mammalian products as well as pumpkin.  She was continued on suppressive doses of antihistamines.  She had already weaned off of Xolair completely.  She was started on a prednisone burst because her hives were not under good control.  For her allergic rhinitis, she was continued on cetirizine.  Her EpiPen was updated.  She called around 1 week after her last appointment with urticaria that worsened  with the weaning of the prednisone.  We put her on an additional round of prednisone and she stated that she thought her sheets were related to her hives.  She obtained new sheets and was in the call us with an update.  Since last visit, she has done well. She is more diligent with washing her sheets and this is helping. She has been good with regards to her hives. The last time that she broke out was last year.   Asthma/Respiratory Symptom History: She was started on Trelegy in 2023 sometime. This is working well.  She feels like a new person with the Trelegy on board. Spring seems to be the worse time of the year for her symptoms. She has not been using her rescue inhaler much at all.   Allergic Rhinitis Symptom History: Allergic rhinitis is under good control on antihistamines.  She was on allergen immunotherapy with maintenance for just over 1 year.  Food Allergy Symptom History: She has been dairy as well as alpha gal. She eats a lot of [place] and chicken.  She did have a couple of accidental exposures with cheesy breaststicks; she ate it without a problem at all. She had an IgE of 0.27 to milk and then 0.11 to pumpkin. She has avoided all pumpkin for a year now. She has been very vigilant about avoiding dairy in all forms.   Skin Symptom History: She has not broken out in hives for over a year.  She has not been on Xolair for just as long.  GERD Symptom History: She remains on her Pepcid daily for her reflux.  This seems to be doing the trick.  She feels that her symptoms are under good control.  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 She denies any new tick bites. They did take a trip to [facility] this summer and had a good time with extended family.   Otherwise, there have been no changes to her past medical history, surgical history, family history, or social history.    Review of Systems  Constitutional: Negative.  Negative for fever,  malaise/fatigue and weight loss.  HENT: Negative.  Negative for congestion, ear discharge and ear pain.   Eyes:  Negative for pain, discharge and redness.  Respiratory:  Negative for cough, sputum production, shortness of breath and wheezing.   Cardiovascular: Negative.  Negative for chest pain and palpitations.  Gastrointestinal:  Negative for abdominal pain, constipation, diarrhea, heartburn, nausea and vomiting.  Skin: Negative.  Negative for itching and rash.  Neurological:  Negative for dizziness and headaches.  Endo/Heme/Allergies:  Negative for environmental allergies. Does not bruise/bleed easily.       Objective:   Blood pressure (!) 142/80, pulse 95, temperature 98.2 F (36.8 C), temperature source Temporal, resp. rate 12, weight 201 lb (91.2 kg), SpO2 99 %. Body mass index is 40.6 kg/m.    Physical Exam Vitals reviewed.  Constitutional:      Appearance: She is well-developed.  HENT:     Head: Normocephalic and atraumatic.  Right Ear: Tympanic membrane, ear canal and external ear normal.     Left Ear: Tympanic membrane, ear canal and external ear normal.     Nose: Mucosal edema and rhinorrhea present. No nasal deformity or septal deviation.     Right Sinus: No maxillary sinus tenderness or frontal sinus tenderness.     Left Sinus: No maxillary sinus tenderness or frontal sinus tenderness.     Mouth/Throat:     Mouth: Mucous membranes are not pale and not dry.     Pharynx: Uvula midline.  Eyes:     General:        Right eye: No discharge.        Left eye: No discharge.     Conjunctiva/sclera: Conjunctivae normal.     Right eye: Right conjunctiva is not injected. No chemosis.    Left eye: Left conjunctiva is not injected. No chemosis.    Pupils: Pupils are equal, round, and reactive to light.  Cardiovascular:     Rate and Rhythm: Normal rate and regular rhythm.     Heart sounds: Normal heart sounds.  Pulmonary:     Effort: Pulmonary effort is normal. No  tachypnea, accessory muscle usage or respiratory distress.     Breath sounds: Normal breath sounds. No wheezing, rhonchi or rales.  Chest:     Chest wall: No tenderness.  Lymphadenopathy:     Cervical: No cervical adenopathy.  Skin:    Coloration: Skin is not pale.     Findings: No abrasion, erythema, petechiae or rash. Rash is not papular, urticarial or vesicular.  Neurological:     Mental Status: She is alert.  Psychiatric:        Behavior: Behavior is cooperative.      Diagnostic studies:    Spirometry: results normal (FEV1: 1.83/99%, FVC: 2.34/102%, FEV1/FVC: 78%).   Spirometry consistent with normal pattern.   Allergy Studies: none       Malachi Bonds, MD  [facility]      /

## 2022-05-24 NOTE — Patient Instructions (Addendum)
Asthma - Lung testing looks good today. - We are not going to make any changes since the Trelegy is working for you.  - Daily controller medication(s): NOTHING - Prior to physical activity: albuterol 2 puffs 10-15 minutes before physical activity. - Rescue medications: albuterol 4 puffs every 4-6 hours as needed - Changes during respiratory infections or worsening symptoms: Add on Trelegy 200/62.5/25 one puff once daily for 1-2 weeks - Asthma control goals:  * Full participation in all desired activities (may need albuterol before activity) * Albuterol use two time or less a week on average (not counting use with activity) * Cough interfering with sleep two time or less a month * Oral steroids no more than once a year * No hospitalizations  2. Hives (urticaria) - At the first sign of any hives, start your regimen of antihistamines (titrating while remaining hive free):  Cetirizine (Zyrtec) 10mg  twice a day and famotidine (Pepcid) 20 mg twice a day. If no symptoms for 7-14 days then decrease to. Cetirizine (Zyrtec) 10mg  twice a day and famotidine (Pepcid) 20 mg once a day.  If no symptoms for 7-14 days then decrease to. Cetirizine (Zyrtec) 10mg  twice a day.  If no symptoms for 7-14 days then decrease to. Cetirizine (Zyrtec) 10mg  once a day.  3. Allergic rhinitis - Continue cetirizine 10 mg once a day as needed for runny nose or itch - Consider saline nasal rinses as needed for nasal symptoms. Use this before any medicated nasal sprays for best result - Continue allergen avoidance measures directed toward pollen, cockroach, dust mite, and pets   4. Alpha gal allergy - We are getting repeat labs today. - We will call you in 1-2 weeks with the results of the testing.  - Epinephrine autoinjector refilled today.   5. Reflux - Continue dietary and lifestyle modifications as listed below - Continue Pepcid daily for reflux control  6. Return in about 1 year (around 05/25/2023).    Please  inform of any Emergency Department visits, hospitalizations, or changes in symptoms. Call before going to the ED for breathing or allergy symptoms since we might be able to fit you in for a sick visit. Feel free to contact anytime with any questions, problems, or concerns.  It was a pleasure to see you again today! I am so glad that things are going well!   Websites that have reliable patient information: 1. American Academy of Asthma, Allergy, and Immunology: www.aaaai.org 2. Food Allergy Research and Education (FARE): foodallergy.org 3. Mothers of Asthmatics: http://www.asthmacommunitynetwork.org 4. American College of Allergy, Asthma, and Immunology: www.acaai.org   COVID-19 Vaccine Information can be found at: 05/27/2023 For questions related to vaccine distribution or appointments, please email [email or web address] or call [phone].   We realize that you might be concerned about having an allergic reaction to the COVID19 vaccines. To help with that concern, WE ARE OFFERING THE COVID19 VACCINES IN OUR OFFICE! Ask the front desk for dates!     "Like" [place] on Facebook and Instagram for our latest updates!      A healthy democracy works best when [place] participate! Make sure you are registered to vote! If you have moved or changed any of your contact information, you will need to get this updated before voting!  In some cases, you MAY be able to register to vote online: [email or web address]

## 2022-05-27 LAB — ALPHA-GAL PANEL
Allergen Lamb IgE: 0.27 kU/L — AB
Beef IgE: 0.25 kU/L — AB
IgE (Immunoglobulin E), Serum: 208 IU/mL (ref 6–495)
O215-IgE Alpha-Gal: 0.48 kU/L — AB
Pork IgE: 0.2 kU/L — AB

## 2022-05-27 LAB — IGE MILK W/ COMPONENT REFLEX: F002-IgE Milk: 0.11 kU/L — AB

## 2022-05-31 DIAGNOSIS — E039 Hypothyroidism, unspecified: Secondary | ICD-10-CM | POA: Diagnosis not present

## 2022-06-24 ENCOUNTER — Telehealth: Payer: Self-pay | Admitting: Allergy & Immunology

## 2022-06-24 NOTE — Telephone Encounter (Signed)
Patient called wanting to schedule a milk challenge. I did read over Dr. Gillermina Hu message where he wants her to be scheduled for an alpha gal challenge. Patient states her levels are lower for milk and wanted to know if she could do that first.

## 2022-06-24 NOTE — Telephone Encounter (Signed)
Please advise 

## 2022-06-27 DIAGNOSIS — M5416 Radiculopathy, lumbar region: Secondary | ICD-10-CM | POA: Diagnosis not present

## 2022-06-28 NOTE — Telephone Encounter (Signed)
We can do milk first. No problem at all.  Salvatore Marvel, MD [facility]

## 2022-06-28 NOTE — Telephone Encounter (Signed)
Pt called back to check on previous message. Please advise.   Best contact number: [phone]

## 2022-06-30 NOTE — Telephone Encounter (Signed)
Sounds good - thank you!   Jakaiden Fill, MD [facility]  

## 2022-07-01 NOTE — Telephone Encounter (Signed)
error 

## 2022-07-01 NOTE — Telephone Encounter (Signed)
Thank you :)

## 2022-07-13 ENCOUNTER — Encounter: Payer: BC Managed Care – PPO | Admitting: Family Medicine

## 2022-08-01 ENCOUNTER — Encounter: Payer: Self-pay | Admitting: Family Medicine

## 2022-08-01 ENCOUNTER — Ambulatory Visit (INDEPENDENT_AMBULATORY_CARE_PROVIDER_SITE_OTHER): Payer: BC Managed Care – PPO | Admitting: Family Medicine

## 2022-08-01 VITALS — BP 110/60 | HR 103 | Temp 98.6°F | Resp 12 | Ht 58.66 in | Wt 203.0 lb

## 2022-08-01 DIAGNOSIS — T7807XA Anaphylactic reaction due to milk and dairy products, initial encounter: Secondary | ICD-10-CM

## 2022-08-01 DIAGNOSIS — T7807XD Anaphylactic reaction due to milk and dairy products, subsequent encounter: Secondary | ICD-10-CM

## 2022-08-01 NOTE — Progress Notes (Signed)
[place] [address] Dept: [phone]  FOLLOW UP NOTE  Patient ID: Mallory Silva, female    DOB: 11-Jan-1966  Age: 56 y.o. MRN: [id number] Date of Office Visit: 08/01/2022  Assessment  Chief Complaint: Food/Drug Challenge  HPI Mallory Silva is a 56 year old female who presents to the clinic for a follow-up visit with a food challenge.  She was last seen in this clinic on 05/24/2022 by Dr. Ernst Bowler for evaluation of asthma, allergic rhinitis, urticaria, reflux, alpha gal, and food allergy to milk, pumpkin, and mammalian meats.  At today's visit, she reports that she is feeling well overall with the exception of slight wheeze which occurred yesterday and resolved with albuterol. She reports that she used her albuterol today before leaving her house to prevent wheeze today. She continues to avoid milk and products containing milk with no accidental ingestion or epinephrine use since her last visit to this clinic. She has not had any antihistamines over the last 3 days and is experiencing some symptoms of allergic rhinitis including post nasal drainage and throat clearing. Otherwise, she denies cardiopulmonary, gastrointestinal, or integumentary symptoms.  Last food testing via lab on 05/24/2022 indicated milk IgE 0.11, and decreasing alpha gal level. Her current medications are listed in the chart.    Drug Allergies:  Allergies  Allergen Reactions   Nsaids Anaphylaxis   Other Shortness Of Breath, Swelling and Other (See Comments)    CAT DANDER (CATS AND/OR CAT HAIR ON CLOTHES, ETC) also dogs   Amlodipine    Lisinopril Swelling    Eyelids and lip swelling   Mirapex [Pramipexole Dihydrochloride] Other (See Comments)    Caused insomnia and wheezing   Nortriptyline Other (See Comments)    Unknown allergic reaction   Penicillins Itching and Rash    Has patient had a PCN reaction causing immediate rash, facial/tongue/throat swelling, SOB or lightheadedness with hypotension:  Yes Has patient had a PCN reaction causing severe rash involving mucus membranes or skin necrosis: No Has patient had a PCN reaction that required hospitalization No Has patient had a PCN reaction occurring within the last 10 years: No If all of the above answers are "NO", then may proceed with Cephalosporin use.   Sulfa Antibiotics Rash   Tape Rash    Please use paper tape    Physical Exam: BP 110/60   Pulse (!) 103   Temp 98.6 F (37 C) (Temporal)   Resp 12   Ht 4' 10.66" (1.49 m)   Wt 203 lb (92.1 kg)   SpO2 98%   BMI 41.48 kg/m    Physical Exam Vitals reviewed.  Constitutional:      Appearance: Normal appearance.  HENT:     Head: Normocephalic and atraumatic.     Right Ear: Tympanic membrane normal.     Left Ear: Tympanic membrane normal.     Nose:     Comments: Bilateral nares normal. Ears normal. Eyes normal.    Mouth/Throat:     Pharynx: Oropharynx is clear.  Eyes:     Conjunctiva/sclera: Conjunctivae normal.  Cardiovascular:     Rate and Rhythm: Normal rate and regular rhythm.     Heart sounds: Normal heart sounds. No murmur heard. Pulmonary:     Effort: Pulmonary effort is normal.     Breath sounds: Normal breath sounds.     Comments: Lungs clear to auscultation Musculoskeletal:        General: Normal range of motion.     Cervical  back: Normal range of motion and neck supple.  Skin:    General: Skin is warm and dry.  Neurological:     Mental Status: She is alert and oriented to person, place, and time.  Psychiatric:        Mood and Affect: Mood normal.        Behavior: Behavior normal.        Thought Content: Thought content normal.        Judgment: Judgment normal.    Procedure note: Written consent obtained Open graded milk oral challenge: The patient was able to tolerate the challenge today without adverse signs or symptoms. Vital signs were stable throughout the challenge and observation period. She received multiple doses separated by 20  minutes, each of which was separated by vitals and a brief physical exam. She reported wheezing in her throat that developed after the 15 ml dose. She denies throat closing, shortness of breath, or cough. Physical exam remained stable. Vital signs were within normal limits. She drank a glass of water and was able to clear some mucus from her throat and the feeling resolved. We decided together to proceed with the challenge.  She received the following doses: lip rub, 5 ml, 15 ml, 35 ml, 55 ml, and 75 ml for a total of 185 ml whole milk.  She was monitored for 60 minutes following the last dose.  Total testing time: 200 minutes  The patient was able to tolerate the open graded oral challenge today without adverse signs or symptoms. Therefore, she has the same risk of systemic reaction associated with the consumption of milk  as the general population.   Assessment and Plan: 1. Anaphylactic shock due to milk products, subsequent encounter     Patient Instructions  In office oral milk challenge Mallory Silva was able to tolerate the milk food challenge today at the office without adverse signs or symptoms of an allergic reaction. Therefore, she has the same risk of systemic reaction associated with the consumption of milk as the general population.  - Do not give any milk or products containing milk for the next 24 hours. - Monitor for allergic symptoms such as rash, wheezing, diarrhea, swelling, and vomiting for the next 24 hours. If severe symptoms occur, treat with EpiPen injection and call 911. For less severe symptoms treat with Benadryl 50 mg every 4 hours and call the clinic.  - If no allergic symptoms are evident, reintroduce milk  into the diet. If you develop an allergic reaction to milk , record what was eaten the amount eaten, preparation method, time from ingestion to reaction, and symptoms.   Food allergy Continue to avoid pumpkin and mammalian meats.  In case of an allergic reaction, take  Benadryl 50 ml every 4 hours, and if life-threatening symptoms occur, inject with AuviQ 0.3 mg.   Call the clinic if this treatment plan is not working well for you.  Follow up on 08/15/2022 or sooner if needed.  Return in about 2 weeks (around 08/15/2022), or if symptoms worsen or fail to improve.    Thank you for the opportunity to care for this patient.  Please do not hesitate to contact me with questions.  Gareth Morgan, FNP [facility]

## 2022-08-01 NOTE — Patient Instructions (Addendum)
In office oral milk challenge Mallory Silva was able to tolerate the milk food challenge today at the office without adverse signs or symptoms of an allergic reaction. Therefore, she has the same risk of systemic reaction associated with the consumption of milk as the general population.  - Do not give any milk or products containing milk for the next 24 hours. - Monitor for allergic symptoms such as rash, wheezing, diarrhea, swelling, and vomiting for the next 24 hours. If severe symptoms occur, treat with EpiPen injection and call 911. For less severe symptoms treat with Benadryl 50 mg every 4 hours and call the clinic.  - If no allergic symptoms are evident, reintroduce milk  into the diet. If you develop an allergic reaction to milk , record what was eaten the amount eaten, preparation method, time from ingestion to reaction, and symptoms.   Food allergy Continue to avoid pumpkin and mammalian meats.  In case of an allergic reaction, take Benadryl 50 ml every 4 hours, and if life-threatening symptoms occur, inject with AuviQ 0.3 mg.   Call the clinic if this treatment plan is not working well for you.  Follow up on 08/15/2022 or sooner if needed.

## 2022-08-15 ENCOUNTER — Other Ambulatory Visit: Payer: Self-pay

## 2022-08-15 ENCOUNTER — Encounter: Payer: Self-pay | Admitting: Family

## 2022-08-15 ENCOUNTER — Ambulatory Visit (INDEPENDENT_AMBULATORY_CARE_PROVIDER_SITE_OTHER): Payer: BC Managed Care – PPO | Admitting: Family

## 2022-08-15 VITALS — BP 128/72 | HR 73 | Temp 98.1°F | Resp 16 | Ht 59.0 in | Wt 200.9 lb

## 2022-08-15 DIAGNOSIS — T781XXA Other adverse food reactions, not elsewhere classified, initial encounter: Secondary | ICD-10-CM

## 2022-08-15 NOTE — Patient Instructions (Addendum)
Mallory Silva was not able to tolerate the beef food challenge today at the office without adverse signs or symptoms of an allergic reaction.   - Monitor for allergic symptoms such as rash, wheezing, diarrhea, swelling, and vomiting for the next 24 hours. If severe symptoms occur, treat with EpiPen injection and call 911. For less severe symptoms treat with Benadryl as below. -Continue to avoid red meat and pumpkin. In case of an allergic reaction, give Benadryl 4 teaspoonfuls every 4 hours, and if life-threatening symptoms occur, inject with AuviQ 0.3 mg.  Schedule a follow-up appointment in 3 to 4 months or sooner if needed

## 2022-08-15 NOTE — Progress Notes (Signed)
[address] [place] [address] Dept: [phone]  FOLLOW UP NOTE  Patient ID: Mallory Silva, female    DOB: 02/15/1966  Age: 56 y.o. MRN: [id number] Date of Office Visit: 08/15/2022  Assessment  Chief Complaint: Food/Drug Challenge (beef)  HPI Mallory Silva is a 56 year old female who presents today for a food challenge to beef.  She was last seen on August 01, 2022 by Thermon Leyland, FNP for an oral food challenge to milk that she passed.  She denies any new diagnosis or surgery since her last office visit.  She been off all antihistamines since Thursday.  She reports that with her original reaction she developed hives, became itchy,  and had numbing/tingling of her lips and tongue.  She also was short of breath and used her albuterol inhaler.  She is in good health today and denies any cardiorespiratory, gastrointestinal, and cutaneous symptoms.  All questions answered and informed consent signed.   Drug Allergies:  Allergies  Allergen Reactions   Nsaids Anaphylaxis   Other Shortness Of Breath, Swelling and Other (See Comments)    CAT DANDER (CATS AND/OR CAT HAIR ON CLOTHES, ETC) also dogs   Amlodipine    Lisinopril Swelling    Eyelids and lip swelling   Mirapex [Pramipexole Dihydrochloride] Other (See Comments)    Caused insomnia and wheezing   Nortriptyline Other (See Comments)    Unknown allergic reaction   Penicillins Itching and Rash    Has patient had a PCN reaction causing immediate rash, facial/tongue/throat swelling, SOB or lightheadedness with hypotension: Yes Has patient had a PCN reaction causing severe rash involving mucus membranes or skin necrosis: No Has patient had a PCN reaction that required hospitalization No Has patient had a PCN reaction occurring within the last 10 years: No If all of the above answers are "NO", then may proceed with Cephalosporin use.   Sulfa Antibiotics Rash   Tape Rash    Please use paper tape    Review of Systems: Review  of Systems  Constitutional:  Negative for chills and fever.  HENT:         Denies rhinorrhea, nasal congestion, and reports postnasal drip  Eyes:        Denies itchy watery eyes  Respiratory:  Negative for cough, shortness of breath and wheezing.   Cardiovascular:  Negative for chest pain and palpitations.  Gastrointestinal:  Negative for abdominal pain, diarrhea, nausea and vomiting.  Genitourinary:  Negative for frequency.  Skin:  Negative for itching and rash.  Neurological:  Negative for headaches.     Physical Exam: BP 130/80   Pulse 68   Temp 98.1 F (36.7 C)   Resp 18   Ht 4\' 11"  (1.499 m)   Wt 200 lb 14.4 oz (91.1 kg)   SpO2 97%   BMI 40.58 kg/m    Physical Exam Constitutional:      Appearance: Normal appearance.  HENT:     Head: Normocephalic and atraumatic.     Comments: Pharynx normal, eyes normal, ears normal, nose normal    Right Ear: Tympanic membrane, ear canal and external ear normal.     Left Ear: Tympanic membrane, ear canal and external ear normal.     Nose: Nose normal.     Mouth/Throat:     Mouth: Mucous membranes are moist.     Pharynx: Oropharynx is clear.  Eyes:     Conjunctiva/sclera: Conjunctivae normal.  Cardiovascular:     Rate and Rhythm: Normal  rate and regular rhythm.     Heart sounds: Normal heart sounds.  Pulmonary:     Effort: Pulmonary effort is normal.     Breath sounds: Normal breath sounds.     Comments: Lungs clear to auscultation Musculoskeletal:     Cervical back: Neck supple.  Skin:    General: Skin is warm.     Comments: No rashes or urticarial lesions noted  Neurological:     Mental Status: She is alert and oriented to person, place, and time.  Psychiatric:        Mood and Affect: Mood normal.        Behavior: Behavior normal.        Thought Content: Thought content normal.        Judgment: Judgment normal.     Diagnostics:  Open graded beef oral challenge: The patient was not able to tolerate the challenge  today without adverse signs or symptoms. Vital signs were stable throughout the challenge and observation period. She received 2 doses separated by 30 minutes, each of which was separated by vitals and a brief physical exam. She received the following doses: 1/4 burger and 1/4 burger. She was monitored for 6 hours following the last dose.   The patient had  IgE to alpha-gal 0.48 kU/L, IgE to beef 0.25 kU/L, IgE pork 0.20 kU/L, and IgE allergen lamb 0.27 kU/L  and wasnot able to tolerate the open graded oral challenge today without adverse signs or symptoms. At 9:14 AM she reports that she feels a little wheezy in her throat. She denies any concomitant gastrointestinal and cutaneous symptoms.  Physical exam and vitals normal.  After speaking she felt like she was fine.  She wonders if these symptoms could due to being off antihistamines.  She also mentions that she felt this way a little bit when she did the milk challenge and everything was normal. At 10:14 a.m. she reports for the past few minutes she has felt wheezing in her throat.  She denies any concomitant gastrointestinal and cutaneous symptoms.  She also denies difficulty swallowing or breathing. At 12:46 PM she took 2 puffs of her albuterol and reports that this helped with the wheezing sound in her throat. She has noticed more drainage down her throat since using her albuterol. Physical exam and vitals are stable.  We discussed stopping the challenge due to these continued symptoms.  She is in agreement.    Assessment and Plan: 1. Allergic reaction to alpha-gal     No orders of the defined types were placed in this encounter.   Patient Instructions  Mallory Silva was not able to tolerate the beef food challenge today at the office without adverse signs or symptoms of an allergic reaction.   - Monitor for allergic symptoms such as rash, wheezing, diarrhea, swelling, and vomiting for the next 24 hours. If severe symptoms occur, treat with EpiPen  injection and call 911. For less severe symptoms treat with Benadryl as below. -Continue to avoid red meat and pumpkin. In case of an allergic reaction, give Benadryl 4 teaspoonfuls every 4 hours, and if life-threatening symptoms occur, inject with AuviQ 0.3 mg.  Schedule a follow-up appointment in 3 to 4 months or sooner if needed   Return in about 3 months (around 11/15/2022), or if symptoms worsen or fail to improve.    Thank you for the opportunity to care for this patient.  Please do not hesitate to contact me with questions.  Althea Charon, FNP [facility]

## 2022-08-29 ENCOUNTER — Encounter: Payer: BC Managed Care – PPO | Admitting: Family

## 2022-09-01 NOTE — Progress Notes (Signed)
Office Visit Note  Patient: Mallory Silva             Date of Birth: Jan 05, 1966           MRN: [id number]             PCP: Leonard Downing, MD Referring: Leonard Downing, * Visit Date: 09/02/2022  Subjective:  New Patient (Initial Visit) (Right foot pain and swelling)   History of Present Illness: Mallory Silva is a 56 y.o. female here for evaluation of recurrent right worse than left foot pain and swelling. This is ongoing for pretty much the past year and saw podiatry with no underlying problem identified. Xray of the right foot in June with mild degenerative changes and soft tissue swelling noted otherwise normal. RA antibodies checked in primary care office were unremarkable as well. Does have lumbar radiculopathy and s/p laminectomy. She has some residual numbness and pain at the left foot 4th and 5th toes related to this but the right foot problems were not present even before her back surgery. She followed up with repeat imaging of the low back since this problem started without evidence of any severe impingement suspected. She cannot take NSAIDs due to severe allergy. Prednisone courses have been helpful with symptoms staying improved a few months before coming back just as severely. She had a minor inversion injury to the right ankle while at the beach during this summer that also temporarily worsened right foot pain. Also with chronic urticaria, asthma, and alpha gal allergy including failed challenge with beef recently at allergy clinic.  Labs reviewed RF neg CCP neg  08/2017 ANA neg ESR 18 CRP 17.1  Activities of Daily Living:  Patient reports morning stiffness for 0  none .   Patient Reports nocturnal pain.  Difficulty dressing/grooming: Denies Difficulty climbing stairs: Denies Difficulty getting out of chair: Denies Difficulty using hands for taps, buttons, cutlery, and/or writing: Denies  Review of Systems  Constitutional:  Positive for fatigue.   HENT:  Negative for mouth sores and mouth dryness.   Eyes:  Negative for dryness.  Respiratory:  Negative for shortness of breath.   Cardiovascular:  Negative for chest pain and palpitations.  Gastrointestinal:  Positive for constipation and diarrhea. Negative for blood in stool.  Endocrine: Negative for increased urination.  Genitourinary:  Positive for involuntary urination.  Musculoskeletal:  Positive for joint swelling. Negative for joint pain, gait problem, joint pain, myalgias, muscle weakness, morning stiffness, muscle tenderness and myalgias.  Skin:  Positive for hair loss and sensitivity to sunlight. Negative for color change and rash.  Allergic/Immunologic: Positive for susceptible to infections.  Neurological:  Negative for dizziness and headaches.  Hematological:  Negative for swollen glands.  Psychiatric/Behavioral:  Negative for depressed mood and sleep disturbance. The patient is nervous/anxious.     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   Seasonal and perennial allergic rhinitis 05/17/2018   Chronic urticaria 08/28/2017   Mild intermittent asthma without complication 24/26/8341   Lumbar radiculopathy 01/03/2012   Lumbar post-laminectomy syndrome 01/03/2012    Past Medical History:  Diagnosis Date   Anxiety    Asthma    Chronic pain syndrome    Depression    Lumbar post-laminectomy syndrome    Urticaria  Family History  Problem Relation Age of Onset   Cancer Mother    Alzheimer's disease Mother    Chronic fatigue Mother    Asthma Mother    Cancer Father    Cancer Sister    Chronic fatigue Sister    Thyroid disease Sister    Depression Sister    Diabetes Sister    Hyperlipidemia Sister    Hypertension Sister    Allergic  rhinitis Sister    Allergic rhinitis Brother    Eczema Grandchild    Allergic rhinitis Son    Eczema Son    Allergic rhinitis Son    Angioedema Neg Hx    Atopy Neg Hx    Immunodeficiency Neg Hx    Urticaria Neg Hx    Past Surgical History:  Procedure Laterality Date   ABDOMINAL HYSTERECTOMY     CESAREAN SECTION     CHOLECYSTECTOMY     GASTRIC BYPASS     GASTRIC BYPASS     HERNIA REPAIR     miscarriage     POLYPECTOMY     SPINE SURGERY     TONSILECTOMY, ADENOIDECTOMY, BILATERAL MYRINGOTOMY AND TUBES     tummy tuck     Social History   Social History Narrative   Not on file   Immunization History  Administered Date(s) Administered   Influenza,inj,quad, With Preservative 07/11/2017     Objective: Vital Signs: BP (!) 157/101 (BP Location: Left Arm, Patient Position: Sitting, Cuff Size: Normal)   Pulse (!) 119   Resp 15   Ht _0  (1.499 m)   Wt 200 lb (90.7 kg)   BMI 40.40 kg/m    Physical Exam Constitutional:      Appearance: She is obese.  Cardiovascular:     Rate and Rhythm: Normal rate and regular rhythm.  Pulmonary:     Effort: Pulmonary effort is normal.     Breath sounds: Normal breath sounds.  Skin:    General: Skin is warm and dry.     Findings: No rash.  Neurological:     Mental Status: She is alert.  Psychiatric:        Mood and Affect: Mood normal.      Musculoskeletal Exam:  Elbows full ROM no tenderness or swelling Wrists full ROM no tenderness or swelling Fingers full ROM no tenderness or swelling Knees full ROM no tenderness or swelling Ankles full ROM no tenderness or swelling There is mild soft tissue swelling on the right foot laterally as compared to left, mild tenderness to pressure over the 4th and 5th metatarsal and at base of the toes   Investigation: No additional findings.  Imaging: No results found.  Recent Labs: Lab Results  Component Value Date   WBC 6.9 10/29/2020   HGB 13.5 10/29/2020   PLT 308 10/29/2020    NA 137 10/29/2020   K 3.7 10/29/2020   CL 103 10/29/2020   CO2 23 10/29/2020   GLUCOSE 99 10/29/2020   BUN 7 10/29/2020   CREATININE 1.03 (H) 10/29/2020   BILITOT 0.8 06/07/2015   ALKPHOS 47 06/07/2015   AST 35 06/07/2015   ALT 23 06/07/2015   PROT 7.0 06/07/2015   ALBUMIN 3.5 06/07/2015   CALCIUM 8.8 (L) 10/29/2020   GFRAA >60 08/28/2017    Speciality Comments: No specialty comments available.  Procedures:  No procedures performed Allergies: Nsaids, Other, Alpha-gal, Amlodipine, Lisinopril, Mirapex [pramipexole dihydrochloride], Nortriptyline, Penicillins, Sulfa antibiotics, and Tape   Assessment / Plan:  Visit Diagnoses: Right foot pain - Plan: Sedimentation rate, C-reactive protein, Uric acid  Presentation remains mostly nonspecific and without obvious cause on plain x-ray and laboratory evaluation so far.  Prednisone responsiveness is pretty nonspecific would like to avoid having to repeatedly treat with steroids to avoid long-term side effects but has poor tolerance of multiple medications.  The associated soft tissue swelling seems less typical for lumbar radiculopathy as the cause.  Will recheck labs including uric acid and serum inflammatory markers but expect this to be nonspecific.  Discussed may benefit with going for MRI of the right foot to better evaluate soft tissue structures, will hold on off on repeating any antiinflammatory treatment prior to this.  Orders: Orders Placed This Encounter  Procedures   Sedimentation rate   C-reactive protein   Uric acid   No orders of the defined types were placed in this encounter.    Follow-Up Instructions: Return if symptoms worsen or fail to improve.   Collier Salina, MD  Note - This record has been created using [employer].  Chart creation errors have been sought, but may not always  have been located. Such creation errors do not reflect on  the standard of medical care.

## 2022-09-02 ENCOUNTER — Ambulatory Visit: Payer: BC Managed Care – PPO | Attending: Internal Medicine | Admitting: Internal Medicine

## 2022-09-02 ENCOUNTER — Encounter: Payer: Self-pay | Admitting: Internal Medicine

## 2022-09-02 VITALS — BP 157/101 | HR 119 | Resp 15 | Ht 59.0 in | Wt 200.0 lb

## 2022-09-02 DIAGNOSIS — M79671 Pain in right foot: Secondary | ICD-10-CM | POA: Diagnosis not present

## 2022-09-02 DIAGNOSIS — M1A0710 Idiopathic chronic gout, right ankle and foot, without tophus (tophi): Secondary | ICD-10-CM

## 2022-09-03 LAB — SEDIMENTATION RATE: Sed Rate: 33 mm/h — ABNORMAL HIGH (ref 0–30)

## 2022-09-03 LAB — C-REACTIVE PROTEIN: CRP: 17 mg/L — ABNORMAL HIGH (ref <8.0)

## 2022-09-03 LAB — URIC ACID: Uric Acid, Serum: 8.7 mg/dL — ABNORMAL HIGH (ref 2.5–7.0)

## 2022-09-05 MED ORDER — COLCHICINE 0.6 MG PO TABS: 0.6000 mg | ORAL_TABLET | Freq: Every day | ORAL | 1 refills | Status: DC | PRN

## 2022-09-05 NOTE — Progress Notes (Signed)
Sedimentation rate and CRP are both elevated indicating ongoing inflammation. Uric acid is 8.7 this is mildly high and could indicate gout but not always. I recommend we try treating this with colchicine for inflammation which should usually help for gout. If beneficial we can discuss longer term options at follow up. Colchicine does not have any particularly high risk of allergic reactions which has been an issue for her. Most common side effects would be diarrhea or nausea.

## 2022-09-05 NOTE — Addendum Note (Signed)
Addended by: Fuller Plan on: 09/05/2022 04:34 PM   Modules accepted: Orders

## 2022-09-08 DIAGNOSIS — Z23 Encounter for immunization: Secondary | ICD-10-CM | POA: Diagnosis not present

## 2022-09-08 DIAGNOSIS — E669 Obesity, unspecified: Secondary | ICD-10-CM | POA: Diagnosis not present

## 2022-09-08 DIAGNOSIS — M79671 Pain in right foot: Secondary | ICD-10-CM | POA: Diagnosis not present

## 2022-09-08 DIAGNOSIS — I10 Essential (primary) hypertension: Secondary | ICD-10-CM | POA: Diagnosis not present

## 2022-09-08 DIAGNOSIS — Z13220 Encounter for screening for lipoid disorders: Secondary | ICD-10-CM | POA: Diagnosis not present

## 2022-09-15 DIAGNOSIS — Z1231 Encounter for screening mammogram for malignant neoplasm of breast: Secondary | ICD-10-CM | POA: Diagnosis not present

## 2022-09-23 DIAGNOSIS — H524 Presbyopia: Secondary | ICD-10-CM | POA: Diagnosis not present

## 2022-10-10 ENCOUNTER — Other Ambulatory Visit: Payer: Self-pay | Admitting: *Deleted

## 2022-10-10 MED ORDER — ALBUTEROL SULFATE HFA 108 (90 BASE) MCG/ACT IN AERS: INHALATION_SPRAY | RESPIRATORY_TRACT | 1 refills | Status: DC

## 2022-10-11 NOTE — Progress Notes (Signed)
Office Visit Note  Patient: Mallory Silva             Date of Birth: May 07, 1966           MRN: [id number]             PCP: Kaleen Mask, MD Referring: Kaleen Mask, * Visit Date: 10/12/2022   Subjective:  Follow-up (Right foot pain and swelling)   History of Present Illness: Mallory Silva is a 57 y.o. female here for follow up for persistent lateral right foot pain and swelling.  At her last visit we checked serum inflammatory markers remain mildly elevated she also had a high uric acid of 8.7.  Recommended trial of oral colchicine she took this medicine without any side effect but did not notice any improvement in her joint symptoms.  She was restarted on a prednisone taper with her PCP Dr. Jeannetta Nap starting at 20 mg daily dose she taper this down to 5 mg now.  Besides the right foot pain and she has not noticed any new joint problems has not had any new illnesses or other medication changes.   Previous HPI 09/02/22 Mallory Silva is a 57 y.o. female here for evaluation of recurrent right worse than left foot pain and swelling. This is ongoing for pretty much the past year and saw podiatry with no underlying problem identified. Xray of the right foot in June with mild degenerative changes and soft tissue swelling noted otherwise normal. RA antibodies checked in primary care office were unremarkable as well. Does have lumbar radiculopathy and s/p laminectomy. She has some residual numbness and pain at the left foot 4th and 5th toes related to this but the right foot problems were not present even before her back surgery. She followed up with repeat imaging of the low back since this problem started without evidence of any severe impingement suspected. She cannot take NSAIDs due to severe allergy. Prednisone courses have been helpful with symptoms staying improved a few months before coming back just as severely. She had a minor inversion injury to the right ankle while at the  beach during this summer that also temporarily worsened right foot pain. Also with chronic urticaria, asthma, and alpha gal allergy including failed challenge with beef recently at allergy clinic.   Labs reviewed RF neg CCP neg   08/2017 ANA neg ESR 18 CRP 17.1   Review of Systems  Constitutional:  Positive for fatigue.  HENT:  Negative for mouth sores and mouth dryness.   Eyes:  Negative for dryness.  Respiratory:  Negative for shortness of breath.   Cardiovascular:  Negative for chest pain and palpitations.  Gastrointestinal:  Positive for constipation and diarrhea. Negative for blood in stool.  Endocrine: Negative for increased urination.  Genitourinary:  Negative for involuntary urination.  Musculoskeletal:  Positive for joint pain, joint pain and joint swelling. Negative for gait problem, myalgias, muscle weakness, morning stiffness, muscle tenderness and myalgias.  Skin:  Positive for sensitivity to sunlight. Negative for color change, rash and hair loss.  Allergic/Immunologic: Negative for susceptible to infections.  Neurological:  Negative for dizziness and headaches.  Hematological:  Negative for swollen glands.  Psychiatric/Behavioral:  Positive for sleep disturbance. Negative for depressed mood. The patient is not nervous/anxious.     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   Seasonal and perennial allergic rhinitis 05/17/2018   Chronic urticaria 08/28/2017   Mild intermittent asthma without complication 11/91/4782   Lumbar radiculopathy 01/03/2012   Lumbar post-laminectomy syndrome 01/03/2012    Past Medical History:  Diagnosis Date   Anxiety    Asthma    Chronic pain syndrome    Depression    Lumbar post-laminectomy syndrome     Urticaria     Family History  Problem Relation Age of Onset   Cancer Mother    Alzheimer's disease Mother    Chronic fatigue Mother    Asthma Mother    Cancer Father    Cancer Sister    Chronic fatigue Sister    Thyroid disease Sister    Depression Sister    Diabetes Sister    Hyperlipidemia Sister    Hypertension Sister    Allergic rhinitis Sister    Allergic rhinitis Brother    Allergic rhinitis Son    Eczema Son    Allergic rhinitis Son    Eczema Grandchild    Angioedema Neg Hx    Atopy Neg Hx    Immunodeficiency Neg Hx    Urticaria Neg Hx    Past Surgical History:  Procedure Laterality Date   ABDOMINAL HYSTERECTOMY     CESAREAN SECTION     CHOLECYSTECTOMY     GASTRIC BYPASS     GASTRIC BYPASS     HERNIA REPAIR     miscarriage     POLYPECTOMY     SPINE SURGERY     TONSILECTOMY, ADENOIDECTOMY, BILATERAL MYRINGOTOMY AND TUBES     tummy tuck     Social History   Social History Narrative   Not on file   Immunization History  Administered Date(s) Administered   Influenza,inj,quad, With Preservative 07/11/2017     Objective: Vital Signs: BP (!) 141/97 (BP Location: Left Arm, Patient Position: Sitting, Cuff Size: Normal)   Pulse 98   Resp 15   Ht 4' 10.75" (1.492 m)   Wt 199 lb (90.3 kg)   BMI 40.54 kg/m    Physical Exam Constitutional:      Appearance: She is obese.  Cardiovascular:     Rate and Rhythm: Normal rate and regular rhythm.  Pulmonary:     Effort: Pulmonary effort is normal.     Breath sounds: Normal breath sounds.  Skin:    General: Skin is warm and dry.     Findings: No rash.  Neurological:     Mental Status: She is alert.  Psychiatric:        Mood and Affect: Mood normal.      Musculoskeletal Exam:  Wrists full ROM no tenderness or swelling Fingers full ROM no tenderness or swelling Knees full ROM no tenderness or swelling Ankles full ROM no tenderness or swelling Mild swelling and tenderness to pressure on the lateral side  of right foot middle to distal half of metatarsal, no erythema, no warmth, ROM intact   Investigation: No additional findings.  Imaging: No results found.  Recent Labs: Lab Results  Component Value Date   WBC 6.9 10/29/2020   HGB 13.5 10/29/2020   PLT 308 10/29/2020   NA 137 10/29/2020   K 3.7 10/29/2020   CL 103 10/29/2020   CO2 23 10/29/2020   GLUCOSE 99 10/29/2020   BUN 7 10/29/2020   CREATININE 1.03 (H) 10/29/2020   BILITOT 0.8 06/07/2015   ALKPHOS  47 06/07/2015   AST 35 06/07/2015   ALT 23 06/07/2015   PROT 7.0 06/07/2015   ALBUMIN 3.5 06/07/2015   CALCIUM 8.8 (L) 10/29/2020   GFRAA >60 08/28/2017    Speciality Comments: No specialty comments available.  Procedures:  No procedures performed Allergies: Nsaids, Other, Alpha-gal, Amlodipine, Lisinopril, Mirapex [pramipexole dihydrochloride], Nortriptyline, Penicillins, Sulfa antibiotics, and Tape   Assessment / Plan:     Visit Diagnoses: Right foot pain - Plan: MR FOOT RIGHT WO CONTRAST  Right foot pain remains ongoing initial workup mostly unremarkable. Pain location, chronic, daily symptoms and total lack of colchicine response does not seem likely to indicate gout as the cause despite mildly high uric acid. Post laminectomy or other neurologic problem does not seem to account for the degree of localized swelling. With a year of unimproved symptoms I recommend we proceed to get MRI of the right foot to rule out underlying structural problem, or if there is specific evidence of synovitis.  Orders: Orders Placed This Encounter  Procedures   MR FOOT RIGHT WO CONTRAST   No orders of the defined types were placed in this encounter.    Follow-Up Instructions: No follow-ups on file.   Collier Salina, MD  Note - This record has been created using [employer].  Chart creation errors have been sought, but may not always  have been located. Such creation errors do not reflect on  the standard of medical  care.

## 2022-10-12 ENCOUNTER — Ambulatory Visit: Payer: BC Managed Care – PPO | Attending: Internal Medicine | Admitting: Internal Medicine

## 2022-10-12 ENCOUNTER — Encounter: Payer: Self-pay | Admitting: Internal Medicine

## 2022-10-12 VITALS — BP 141/97 | HR 98 | Resp 15 | Ht 58.75 in | Wt 199.0 lb

## 2022-10-12 DIAGNOSIS — M79671 Pain in right foot: Secondary | ICD-10-CM | POA: Diagnosis not present

## 2022-10-25 DIAGNOSIS — Z01419 Encounter for gynecological examination (general) (routine) without abnormal findings: Secondary | ICD-10-CM | POA: Diagnosis not present

## 2022-10-25 DIAGNOSIS — N898 Other specified noninflammatory disorders of vagina: Secondary | ICD-10-CM | POA: Diagnosis not present

## 2022-10-25 DIAGNOSIS — Z6841 Body Mass Index (BMI) 40.0 and over, adult: Secondary | ICD-10-CM | POA: Diagnosis not present

## 2022-11-03 ENCOUNTER — Encounter: Payer: BC Managed Care – PPO | Admitting: Internal Medicine

## 2022-11-18 ENCOUNTER — Ambulatory Visit (INDEPENDENT_AMBULATORY_CARE_PROVIDER_SITE_OTHER): Payer: BC Managed Care – PPO | Admitting: Podiatry

## 2022-11-18 ENCOUNTER — Ambulatory Visit (INDEPENDENT_AMBULATORY_CARE_PROVIDER_SITE_OTHER): Payer: BC Managed Care – PPO

## 2022-11-18 ENCOUNTER — Encounter: Payer: Self-pay | Admitting: Podiatry

## 2022-11-18 DIAGNOSIS — M778 Other enthesopathies, not elsewhere classified: Secondary | ICD-10-CM

## 2022-11-18 DIAGNOSIS — M79671 Pain in right foot: Secondary | ICD-10-CM | POA: Diagnosis not present

## 2022-11-18 DIAGNOSIS — M25571 Pain in right ankle and joints of right foot: Secondary | ICD-10-CM | POA: Diagnosis not present

## 2022-11-18 DIAGNOSIS — M792 Neuralgia and neuritis, unspecified: Secondary | ICD-10-CM | POA: Diagnosis not present

## 2022-11-18 DIAGNOSIS — M7671 Peroneal tendinitis, right leg: Secondary | ICD-10-CM

## 2022-11-18 DIAGNOSIS — M7731 Calcaneal spur, right foot: Secondary | ICD-10-CM | POA: Diagnosis not present

## 2022-11-18 DIAGNOSIS — M7989 Other specified soft tissue disorders: Secondary | ICD-10-CM | POA: Diagnosis not present

## 2022-11-18 NOTE — Progress Notes (Signed)
  Subjective:  Patient ID: Mallory Silva, female    DOB: 08-Apr-1966,   MRN: [id number]  Chief Complaint  Patient presents with   Foot Pain    Right foot pain on going since before the holidays , constant pain     57 y.o. female presents for follow-up of bilateral foot pain. Relates todays pain is mostly in the right foot and ankle. She has seen her rheumatologist and suspected gout and has been treated with colchicine although this did not help.  . States medrol dose pack helps but pain just returns after and does not have much of a difference. Relates it comes and goes and today she is having pain on the outside of the foot to toch. .  . Denies any other pedal complaints. Denies n/v/f/c.   Past Medical History:  Diagnosis Date   Anxiety    Asthma    Chronic pain syndrome    Depression    Lumbar post-laminectomy syndrome    Urticaria     Objective:  Physical Exam: Vascular: DP/PT pulses 2/4 bilateral. CFT <3 seconds. Normal hair growth on digits.  Mild edema to bilateral lower extremities and dorsal feet.  Skin. No lacerations or abrasions bilateral feet.  Musculoskeletal: MMT 5/5 bilateral lower extremities in DF, PF, Inversion and Eversion. Deceased ROM in DF of ankle joint. Today she does have tenderness along the insertion of the peroneal tendon. She has pain with PF and eversion as well of the foot. No pain to palpation anywhere else about the foot.  Neurological: Sensation intact to light touch.   Assessment:   1. Peroneal tendonitis, right   2. Neuritis   3. Capsulitis of foot, right       Plan:  Patient was evaluated and treated and all questions answered. -Xrays reviewed. No acute fractures or dislocations, some mild degenerative changes in the midfoot. Ankle with no acute fractures or dislocations.  Discussed peroneal tendinitis and treatment options at length with patient Discussed stretching exercises and provided handout. Discussed trying injection but will hold  off until after MRI results. Discussed if tear of tendon this possibly could require surgery.  Dispensed Tri-Lock ankle brace. MRI ordered for surgical planning and elucidate foot pain.  Patient to return in 6 to 8 weeks or sooner if symptoms fail to improve or worsen.    Lorenda Peck, DPM

## 2022-11-18 NOTE — Patient Instructions (Signed)
Peroneal Tendinopathy Rehab Ask your health care provider which exercises are safe for you. Do exercises exactly as told by your health care provider and adjust them as directed. It is normal to feel mild stretching, pulling, tightness, or discomfort as you do these exercises. Stop right away if you feel sudden pain or your pain gets worse. Do not begin these exercises until told by your health care provider. Stretching and range-of-motion exercises These exercises warm up your muscles and joints. They can help improve the movement and flexibility of your ankle. They may also help to relieve pain and stiffness. Gastrocnemius and soleus stretch, standing This is an exercise in which you stand on a step and use your body weight to stretch your calf muscles. To do this exercise: Stand on the edge of a step on the ball of your left / right foot. The ball of your foot is on the walking surface, right under your toes. Keep your other foot firmly on the same step. Hold on to the wall, a railing, or a chair for balance. Slowly lift your other foot, allowing your body weight to press your left / right heel down over the edge of the step. You should feel a stretch in your left / right calf (gastrocnemius and soleus). Hold this position for __________ seconds. Return both feet to the step. Repeat this exercise with a slight bend in your left / right knee. Repeat __________ times with your left / right knee straight and __________ times with your left / right knee bent. Complete this exercise __________ times a day. Strengthening exercises These exercises build strength and endurance in your foot and ankle. Endurance is the ability to use your muscles for a long time, even after they get tired. Ankle dorsiflexion with band  Secure a rubber exercise band or tube to an object, such as a table leg, that will not move when the band is pulled. Secure the other end of the band around your left / right foot. Sit on  the floor. Face the object with your left / right leg extended. The band or tube should be slightly tense when your foot is relaxed. Slowly flex your left / right ankle and toes to bring your foot toward you (dorsiflexion). Hold this position for __________ seconds. Let the band or tube slowly pull your foot back to the starting position. Repeat __________ times. Complete this exercise __________ times a day. Ankle eversion  Sit on the floor with your legs straight out in front of you. Loop a rubber exercise band or tube around the ball of your left / right foot. The ball of your foot is on the walking surface, right under your toes. Hold the ends of the band in your hands. You can also secure the band to a stable object. The band or tube should be slightly tense when your foot is relaxed. Slowly push your foot outward, away from your other leg (eversion). Hold this position for __________ seconds. Slowly return your foot to the starting position. Repeat __________ times. Complete this exercise __________ times a day. Plantar flexion, standing This exercise is sometimes called a standing heel raise. Stand with your feet shoulder-width apart. Place your hands on a wall or table to steady yourself as needed. Try not to use it for support. Keep your weight spread evenly over the width of your feet while you slowly rise up on your toes (plantar flexion). If told by your health care provider: Shift your weight  toward your left / right leg until you feel challenged. Stand on your left / right leg only. Hold this position for __________ seconds. Repeat __________ times. Complete this exercise __________ times a day. Single leg stand  Without shoes, stand near a railing or in a doorway. You may hold on to the railing or doorframe as needed. Stand on your left / right foot. Keep your big toe down on the floor and try to keep your arch lifted. Do not roll to the outside of your foot. If this  exercise is too easy, you can try it with your eyes closed or while standing on a pillow. Hold this position for __________ seconds. Repeat __________ times. Complete this exercise __________ times a day. This information is not intended to replace advice given to you by your health care provider. Make sure you discuss any questions you have with your health care provider. Document Revised: 01/06/2022 Document Reviewed: 01/06/2022 Elsevier Patient Education  [employer].

## 2022-11-28 ENCOUNTER — Telehealth: Payer: Self-pay | Admitting: Podiatry

## 2022-11-28 NOTE — Telephone Encounter (Signed)
Pt called & stated that she is now experiencing the same symptoms on the left that she was on the right foot. States that her left foot is worse than the right; it's swollen & very painful, not able to apply pressure or put her shoe on. Pt wants to know if a MRI can be done on the left foot when she goes to have the right foot done or does she need to come in to have that foot examined as well? Please advise

## 2022-11-28 NOTE — Telephone Encounter (Signed)
I will have to examine the left foot first before setting up MRI for the left.

## 2022-11-29 ENCOUNTER — Ambulatory Visit
Admission: RE | Admit: 2022-11-29 | Discharge: 2022-11-29 | Disposition: A | Discharge status: Home / Self Care | Payer: BC Managed Care – PPO | Source: Ambulatory Visit | Attending: Podiatry | Admitting: Podiatry

## 2022-11-29 DIAGNOSIS — M792 Neuralgia and neuritis, unspecified: Secondary | ICD-10-CM

## 2022-11-29 DIAGNOSIS — M7731 Calcaneal spur, right foot: Secondary | ICD-10-CM | POA: Diagnosis not present

## 2022-11-29 DIAGNOSIS — M7671 Peroneal tendinitis, right leg: Secondary | ICD-10-CM

## 2022-11-29 DIAGNOSIS — M778 Other enthesopathies, not elsewhere classified: Secondary | ICD-10-CM

## 2022-11-29 DIAGNOSIS — S86311A Strain of muscle(s) and tendon(s) of peroneal muscle group at lower leg level, right leg, initial encounter: Secondary | ICD-10-CM | POA: Diagnosis not present

## 2022-12-01 ENCOUNTER — Other Ambulatory Visit: Payer: BC Managed Care – PPO

## 2023-01-24 ENCOUNTER — Encounter: Payer: BC Managed Care – PPO | Admitting: Family

## 2023-03-14 ENCOUNTER — Telehealth: Payer: Self-pay | Admitting: Allergy & Immunology

## 2023-03-14 DIAGNOSIS — T781XXA Other adverse food reactions, not elsewhere classified, initial encounter: Secondary | ICD-10-CM

## 2023-03-14 NOTE — Telephone Encounter (Signed)
Patient called to reschedule pork challenge. Patient was originally scheduled for challenge 08/2022.   Labs were last done 04/2022.   Spoke to Dr. Dellis Anes to see next steps.   Dr. Dellis Anes requested patient get repeat labs as it will almost be a year since she last had labs done. Order put in by Dr. Dellis Anes.   Advised patient to get repeat labs, advised her she could get them done here in office or at any [facility]. Patient verbalized understanding.

## 2023-06-15 DIAGNOSIS — T781XXA Other adverse food reactions, not elsewhere classified, initial encounter: Secondary | ICD-10-CM | POA: Diagnosis not present

## 2023-06-18 LAB — ALPHA-GAL PANEL
Allergen Lamb IgE: 1.03 kU/L — AB
Beef IgE: 1.35 kU/L — AB
IgE (Immunoglobulin E), Serum: 2169 IU/mL — ABNORMAL HIGH (ref 6–495)
O215-IgE Alpha-Gal: 3.44 kU/L — AB
Pork IgE: 0.64 kU/L — AB

## 2023-06-19 ENCOUNTER — Other Ambulatory Visit: Payer: Self-pay | Admitting: Family Medicine

## 2023-06-20 NOTE — Telephone Encounter (Signed)
Denied albuterol refill at [facility]. Patient last seen by Nehemiah Settle FNP on 07/2022 was advised to follow up in 3 months. I see patient has canceled a few appointments. Needs visit before refills can be approved

## 2023-06-21 ENCOUNTER — Other Ambulatory Visit: Payer: Self-pay | Admitting: Family Medicine

## 2023-06-21 ENCOUNTER — Ambulatory Visit (INDEPENDENT_AMBULATORY_CARE_PROVIDER_SITE_OTHER): Payer: BC Managed Care – PPO | Admitting: Family

## 2023-06-21 ENCOUNTER — Encounter: Payer: Self-pay | Admitting: Family

## 2023-06-21 ENCOUNTER — Telehealth: Payer: Self-pay | Admitting: Allergy & Immunology

## 2023-06-21 VITALS — BP 130/70 | HR 77 | Temp 98.0°F | Resp 16 | Ht 59.0 in | Wt 206.8 lb

## 2023-06-21 DIAGNOSIS — T7800XD Anaphylactic reaction due to unspecified food, subsequent encounter: Secondary | ICD-10-CM | POA: Diagnosis not present

## 2023-06-21 DIAGNOSIS — T781XXA Other adverse food reactions, not elsewhere classified, initial encounter: Secondary | ICD-10-CM | POA: Diagnosis not present

## 2023-06-21 DIAGNOSIS — J45998 Other asthma: Secondary | ICD-10-CM | POA: Diagnosis not present

## 2023-06-21 DIAGNOSIS — L500 Allergic urticaria: Secondary | ICD-10-CM | POA: Diagnosis not present

## 2023-06-21 DIAGNOSIS — J4540 Moderate persistent asthma, uncomplicated: Secondary | ICD-10-CM

## 2023-06-21 DIAGNOSIS — L299 Pruritus, unspecified: Secondary | ICD-10-CM

## 2023-06-21 DIAGNOSIS — K219 Gastro-esophageal reflux disease without esophagitis: Secondary | ICD-10-CM | POA: Diagnosis not present

## 2023-06-21 MED ORDER — FLUTICASONE PROPIONATE HFA 110 MCG/ACT IN AERO: INHALATION_SPRAY | RESPIRATORY_TRACT | 5 refills | Status: AC

## 2023-06-21 MED ORDER — EPINEPHRINE 0.3 MG/0.3ML IJ SOAJ: 0.3000 mg | INTRAMUSCULAR | 1 refills | Status: AC | PRN

## 2023-06-21 NOTE — Patient Instructions (Addendum)
Asthma- not well controlled.  Stop Trelegy during respiratory infections - Daily controller medication(s): Start Flovent 110 mcg 2 puffs twice a day with spacer to help prevent cough and wheeze. Spacer given along with demonstration - Prior to physical activity: albuterol 2 puffs 10-15 minutes before physical activity. - Rescue medications: albuterol 4 puffs every 4-6 hours as needed - Asthma control goals:  * Full participation in all desired activities (may need albuterol before activity) * Albuterol use two time or less a week on average (not counting use with activity) * Cough interfering with sleep two time or less a month * Oral steroids no more than once a year * No hospitalizations  2. Hives (urticaria)- none since last office visit - At the first sign of any hives, start your regimen of antihistamines (titrating while remaining hive free):  Cetirizine (Zyrtec) 10mg  twice a day and famotidine (Pepcid) 20 mg twice a day. If no symptoms for 7-14 days then decrease to. Cetirizine (Zyrtec) 10mg  twice a day and famotidine (Pepcid) 20 mg once a day.  If no symptoms for 7-14 days then decrease to. Cetirizine (Zyrtec) 10mg  twice a day.  If no symptoms for 7-14 days then decrease to. Cetirizine (Zyrtec) 10mg  once a day.  3. Allergic rhinitis- controlled - Continue cetirizine 10 mg once a day as needed for runny nose or itch - Consider saline nasal rinses as needed for nasal symptoms. Use this before any medicated nasal sprays for best result - Continue allergen avoidance measures directed toward pollen, cockroach, dust mite, and pets   4. Alpha gal allergy -Continue to avoid mammalian meat (red meat )and pumpkin. In case of an allergic reaction, give Benadryl 4 teaspoonfuls every 4 hours, and if life-threatening symptoms occur, inject with AuviQ 0.3 mg. Refill sent  5. Reflux - Continue dietary and lifestyle modifications as listed below - Continue Pepcid daily for reflux  control  6.Pruritus (itch with no rash or hives) -I will get lab work to look for the cause for your itching. (Cbc with diff, CMP, and thyroid cascade) I will also get IgE to milk due to your concern of your milk allergy returning. We will call you with results once they are all back -While we are waiting on lab work you can try avoiding dairy to see if it causes your itching to stop.  If it does not make a difference in your itching go ahead and add dairy back to your diet. -Increase Zyrtec (cetirizine) 10 mg 1 tablet twice a day. Caution as this may make you sleepy. -If your symptoms re-occur, begin a journal of events that occurred for up to 6 hours before your symptoms began including foods and beverages consumed, soaps or perfumes you had contact with, and medications.     Schedule a follow up appointment in 4 weeks with Dr. Dellis Anes or sooner if needed

## 2023-06-21 NOTE — Progress Notes (Signed)
[address] [place] [address] Dept: [phone]  FOLLOW UP NOTE  Patient ID: Mallory Silva, female    DOB: 1966/02/16  Age: 57 y.o. MRN: [id number] Date of Office Visit: 06/21/2023  Assessment  Chief Complaint: Follow-up (Lab questions and refills.)  HPI Mallory Silva is a 58 year old female who presents today for an acute visit of itch.  She was last seen by myself on August 23, 2022 where she did not pass the  food challenge to beef.  She denies any new diagnosis or surgeries since her last office.  She passed her oral food challenge to milk on August 01, 2022.  She was also seen by Dr. 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  She reports for the past month she has had  itching, but no rashes or hives.  She first thought that the itching is from her clothes.  She has tried different detergents such as arm and Hammer.  She has also spoken with a another person that has alpha gal and they recommended Mallory Silva.  She has not tried this yet.  She denies any concomitant cardiorespiratory and gastrointestinal symptoms.  She is not on any new medications and not taking any new foods.  She does however wonder if dairy could be causing the itching.  She has not tried avoiding dairy to see if this helps.  She is not using any new products.  She is using Cetaphil in the shower and Dr. Su Monks soap.  No one else in the household is itchy.  She denies any fever, chills, or joint pain.  She reports that she does have bug bites.  She wears [employer] skin so soft and has used this in the past without any problems.  She is currently taking Zyrtec once a day,  but stayed off Zyrtec for a week before getting her recent alpha gal lab.  She reports that her mother and another sibling previously had issues with itching.  She also is on levothyroxine for her thyroid.  Asthma is reported as not well controlled in the past month.  She reports that she is having to use her albuterol more often.  She was previously using it 1-2 times a week and is now using 1-2 times a day.  She reports a dry cough, wheezing, and shortness of breath.  She denies tightness in chest and nocturnal awakenings due to breathing problems.  She denies any recent surgeries.  She reports that when she uses her albuterol it does help.  She has not tried using Trelegy for the symptoms that she has for upper respiratory infections.  Since her last office visit she has not required any trips to the emergency room or urgent care due to breathing problems.  She has been on steroids a couple weeks ago due to spurs in her feet and other issues with her feet.  Allergic rhinitis: She reports postnasal drip when she is off of Zyrtec.  She denies rhinorrhea and nasal congestion.  She has not been treated for any sinus infections since we last saw her.  She does take Zyrtec 10 mg once a day and does not use saline nasal spray or any nose sprays.  Alpha gal allergy: She  continues to avoid mammalian meat without any accidental ingestion.  She reports that she mainly eats from home but does occasionally eat out.  She eats a lot of chicken.  She also reports that she avoids pumpkin.  This showed up positive on skin testing.  She has not had to use her epinephrine autoinjector device since her last office visit.  Reflux is reported as controlled with Pepcid once a day.  She denies heartburn or reflux symptoms.   Drug Allergies:  Allergies  Allergen Reactions   Nsaids Anaphylaxis   Other Shortness Of Breath, Swelling and Other (See Comments)    CAT DANDER (CATS AND/OR CAT HAIR ON CLOTHES, ETC) also dogs    Alpha-Gal    Amlodipine    Lisinopril Swelling    Eyelids and lip swelling   Mirapex [Pramipexole Dihydrochloride] Other (See Comments)    Caused insomnia and wheezing   Nortriptyline Other (See Comments)    Unknown allergic reaction   Penicillins Itching and Rash    Has patient had a PCN reaction causing immediate rash, facial/tongue/throat swelling, SOB or lightheadedness with hypotension: Yes Has patient had a PCN reaction causing severe rash involving mucus membranes or skin necrosis: No Has patient had a PCN reaction that required hospitalization No Has patient had a PCN reaction occurring within the last 10 years: No If all of the above answers are "NO", then may proceed with Cephalosporin use.   Sulfa Antibiotics Rash   Tape Rash    Please use paper tape    Review of Systems: Negative except as per HPI  Physical Exam: BP 130/70   Pulse 77   Temp 98 F (36.7 C) (Axillary)   Resp 16   Ht 4\' 11"  (1.499 m)   Wt 206 lb 12.8 oz (93.8 kg)   SpO2 99%   BMI 41.77 kg/m    Physical Exam Constitutional:      Appearance: Normal appearance.  HENT:     Head: Normocephalic and atraumatic.     Comments: Pharynx normal, eyes normal, ears normal, nose normal    Right Ear: Tympanic membrane, ear canal and external ear normal.     Left Ear: Tympanic membrane, ear canal and external ear normal.     Nose: Nose normal.     Mouth/Throat:     Mouth: Mucous membranes are moist.     Pharynx: Oropharynx is clear.  Eyes:     Conjunctiva/sclera: Conjunctivae normal.  Cardiovascular:     Rate and Rhythm: Regular rhythm.     Heart sounds: Normal heart sounds.  Pulmonary:     Effort: Pulmonary effort is normal.     Breath sounds: Normal breath sounds.     Comments: Lungs clear to auscultation Musculoskeletal:     Cervical back: Neck supple.  Skin:    General: Skin is warm.     Comments: No rashes or urticarial lesions noted  Neurological:     Mental Status: She is alert and oriented  to person, place, and time.  Psychiatric:        Mood and Affect: Mood normal.        Behavior: Behavior normal.        Thought Content: Thought content normal.        Judgment: Judgment normal.     Diagnostics: FVC 2.46 L (92%), FEV1 1.88 L (88%), FEV1/FVC 0.76 L.  Spirometry indicates normal spirometry.  Assessment and Plan: 1. Pruritus   2. Not well controlled moderate persistent asthma  3. Allergic reaction to alpha-gal   4. Anaphylactic shock due to food, subsequent encounter   5. Allergic urticaria   6. Gastroesophageal reflux disease, unspecified whether esophagitis present     Meds ordered this encounter  Medications   fluticasone (FLOVENT HFA) 110 MCG/ACT inhaler    Sig: Inhale 2 puffs twice  a day with spacer. Rinse mouth out after.    Dispense:  1 each    Refill:  5   EPINEPHrine (AUVI-Q) 0.3 mg/0.3 mL IJ SOAJ injection    Sig: Inject 0.3 mg into the muscle as needed for anaphylaxis.    Dispense:  2 each    Refill:  1    Home [phone] or cell [phone]    Patient Instructions  Asthma- not well controlled.  Stop Trelegy during respiratory infections - Daily controller medication(s): Start Flovent 110 mcg 2 puffs twice a day with spacer to help prevent cough and wheeze. Spacer given along with demonstration - Prior to physical activity: albuterol 2 puffs 10-15 minutes before physical activity. - Rescue medications: albuterol 4 puffs every 4-6 hours as needed - Asthma control goals:  * Full participation in all desired activities (may need albuterol before activity) * Albuterol use two time or less a week on average (not counting use with activity) * Cough interfering with sleep two time or less a month * Oral steroids no more than once a year * No hospitalizations  2. Hives (urticaria)- none since last office visit - At the first sign of any hives, start your regimen of antihistamines (titrating while remaining hive free):  Cetirizine (Zyrtec) 10mg   twice a day and famotidine (Pepcid) 20 mg twice a day. If no symptoms for 7-14 days then decrease to. Cetirizine (Zyrtec) 10mg  twice a day and famotidine (Pepcid) 20 mg once a day.  If no symptoms for 7-14 days then decrease to. Cetirizine (Zyrtec) 10mg  twice a day.  If no symptoms for 7-14 days then decrease to. Cetirizine (Zyrtec) 10mg  once a day.  3. Allergic rhinitis- controlled - Continue cetirizine 10 mg once a day as needed for runny nose or itch - Consider saline nasal rinses as needed for nasal symptoms. Use this before any medicated nasal sprays for best result - Continue allergen avoidance measures directed toward pollen, cockroach, dust mite, and pets   4. Alpha gal allergy -Continue to avoid mammalian meat (red meat )and pumpkin. In case of an allergic reaction, give Benadryl 4 teaspoonfuls every 4 hours, and if life-threatening symptoms occur, inject with AuviQ 0.3 mg. Refill sent  5. Reflux - Continue dietary and lifestyle modifications as listed below - Continue Pepcid daily for reflux control  6.Pruritus (itch with no rash or hives) -I will get lab work to look for the cause for your itching. (Cbc with diff, CMP, and thyroid cascade) I will also get IgE to milk due to your concern of your milk allergy returning. We will call you with results once they are all back -While we are waiting on lab work you can try avoiding dairy to see if it causes your itching to stop.  If it does not make a difference in your itching go ahead and add dairy back to your diet. -Increase Zyrtec (cetirizine) 10 mg 1 tablet twice a day. Caution as this may make you sleepy. -If your symptoms re-occur, begin a journal of events that occurred for up to 6 hours before your symptoms began including foods and beverages consumed, soaps or perfumes you had contact with,  and medications.     Schedule a follow up appointment in 4 weeks with Dr. Dellis Silva or sooner if needed     Return in about 4 weeks  (around 07/19/2023), or if symptoms worsen or fail to improve.    Thank you for the opportunity to care for this patient.  Please do not hesitate to contact me with questions.  Nehemiah Settle, FNP [facility]

## 2023-06-21 NOTE — Telephone Encounter (Signed)
Patient is requesting for labs to be resulted. She saw them come in via [place] and would like to know her results.   Best contact number: [phone]

## 2023-06-21 NOTE — Telephone Encounter (Signed)
Forwarding message to provider to review lab results - resulted on 06/18/23(Sunday).

## 2023-06-21 NOTE — Telephone Encounter (Signed)
Patient called in about refill. Advised patient last refill request was denied due to pt needing appointment. Patient scheduled to see Chrissie today, 06-21-2023.

## 2023-06-22 ENCOUNTER — Telehealth: Payer: Self-pay

## 2023-06-22 ENCOUNTER — Other Ambulatory Visit (HOSPITAL_COMMUNITY): Payer: Self-pay

## 2023-06-22 LAB — CBC WITH DIFFERENTIAL/PLATELET
Basophils Absolute: 0.1 10*3/uL (ref 0.0–0.2)
Basos: 1 %
EOS (ABSOLUTE): 0.2 10*3/uL (ref 0.0–0.4)
Eos: 2 %
Hematocrit: 38.1 % (ref 34.0–46.6)
Hemoglobin: 12.4 g/dL (ref 11.1–15.9)
Immature Grans (Abs): 0 10*3/uL (ref 0.0–0.1)
Immature Granulocytes: 0 %
Lymphocytes Absolute: 1.7 10*3/uL (ref 0.7–3.1)
Lymphs: 20 %
MCH: 28 pg (ref 26.6–33.0)
MCHC: 32.5 g/dL (ref 31.5–35.7)
MCV: 86 fL (ref 79–97)
Monocytes Absolute: 0.8 10*3/uL (ref 0.1–0.9)
Monocytes: 9 %
Neutrophils Absolute: 5.6 10*3/uL (ref 1.4–7.0)
Neutrophils: 68 %
Platelets: 376 10*3/uL (ref 150–450)
RBC: 4.43 x10E6/uL (ref 3.77–5.28)
RDW: 16.5 % — ABNORMAL HIGH (ref 11.7–15.4)
WBC: 8.4 10*3/uL (ref 3.4–10.8)

## 2023-06-22 LAB — COMPREHENSIVE METABOLIC PANEL
ALT: 41 IU/L — ABNORMAL HIGH (ref 0–32)
AST: 49 IU/L — ABNORMAL HIGH (ref 0–40)
Albumin: 4.6 g/dL (ref 3.8–4.9)
Alkaline Phosphatase: 55 IU/L (ref 44–121)
BUN/Creatinine Ratio: 11 (ref 9–23)
BUN: 9 mg/dL (ref 6–24)
Bilirubin Total: 0.5 mg/dL (ref 0.0–1.2)
CO2: 25 mmol/L (ref 20–29)
Calcium: 9.8 mg/dL (ref 8.7–10.2)
Chloride: 93 mmol/L — ABNORMAL LOW (ref 96–106)
Creatinine, Ser: 0.83 mg/dL (ref 0.57–1.00)
Globulin, Total: 2.7 g/dL (ref 1.5–4.5)
Glucose: 97 mg/dL (ref 70–99)
Potassium: 3.8 mmol/L (ref 3.5–5.2)
Sodium: 135 mmol/L (ref 134–144)
Total Protein: 7.3 g/dL (ref 6.0–8.5)
eGFR: 81 mL/min/{1.73_m2} (ref >59)

## 2023-06-22 LAB — THYROID CASCADE PROFILE: TSH: 1.58 u[IU]/mL (ref 0.450–4.500)

## 2023-06-22 NOTE — Telephone Encounter (Signed)
Done at office visit.

## 2023-06-22 NOTE — Telephone Encounter (Signed)
Per PA Team:  Preferred meds are Arnuity, Asmanex or Qvar. Please advise or send in new prescription if appropriate.    Forwarding message to provider for next step.

## 2023-06-22 NOTE — Telephone Encounter (Signed)
*  Asthma/Allergy  Pharmacy Patient Advocate Encounter   Received notification from CoverMyMeds that prior authorization for Fluticasone Propionate HFA 110MCG/ACT aerosol  is required/requested.   Insurance verification completed.   The patient is insured through [facility] .   Per test claim:  Arnuity, Asmanex or Qvar is preferred by the insurance.  If suggested medication is appropriate, Please send in a new RX and discontinue this one. If not, please advise as to why it's not appropriate so that we may request a Prior Authorization.   CMM Key: [id number]

## 2023-06-22 NOTE — Addendum Note (Signed)
Addended by: Rolland Bimler D on: 06/22/2023 04:52 PM   Modules accepted: Orders

## 2023-06-23 MED ORDER — QVAR REDIHALER 80 MCG/ACT IN AERB: 2.0000 | INHALATION_SPRAY | Freq: Two times a day (BID) | RESPIRATORY_TRACT | 0 refills | Status: AC

## 2023-06-23 NOTE — Telephone Encounter (Signed)
Alpha -Gal Panel resulted on 06/18/23.  The other have now resulted as well.

## 2023-06-23 NOTE — Telephone Encounter (Signed)
Please change to Qvar 80 mcg 2 puffs twice a day. Rinse mouth out after. She will not use a spacer with this inhaler. She can have the pharmacy give a demonstration on how to use or come by our office for a demonstration.

## 2023-06-23 NOTE — Telephone Encounter (Signed)
Left a message telling patient about the Qvar and that it was sent into the pharmacy.  Pam [phone]

## 2023-06-23 NOTE — Telephone Encounter (Signed)
Resulted yesterday

## 2023-06-26 ENCOUNTER — Other Ambulatory Visit (HOSPITAL_COMMUNITY): Payer: Self-pay

## 2023-06-27 ENCOUNTER — Telehealth: Payer: Self-pay

## 2023-06-27 NOTE — Telephone Encounter (Signed)
Per NP Nehemiah Settle. Please send a copy of these lab results for her primary care physician to review. Please see labs that were drawn on 06/21/2023. I verified PCP with patient her pcp is Dr.Wilson Elkins,MD.

## 2023-06-27 NOTE — Progress Notes (Signed)
Please let Mallory Silva know that we received her lab work. I reviewed her lab results with Dr. Dellis Anes.  Milk: was elevated, but low. Did she try avoiding dairy to see if this stopped the itching she was having at her last office visit? If she has not tried stopping dairy, I recommend avoiding dairy for now and have access to her epinephrine auto injector device.  Cbc with diff: normal  CMP: her ALT and AST are elevated, but not at a level that I think would cause her itching. Please send a copy of these lab results for her primary care physician to review.   Thyroid cascade profile: normal.   Is she still itching?

## 2023-06-27 NOTE — Progress Notes (Signed)
Labs resulted today. Thanks!

## 2023-06-27 NOTE — Progress Notes (Signed)
So is she using Flovent?  You stated that she feels no different with Flovent. It looks like her insurance did not prefer Flovent and we had to switch to another inhaler. Have her start Qvar 2 puffs twice a day. If her shortness of breath worsens or does not get better. I recommend that she go to urgent care or the emergency room.She also needs to schedule a 4 week follow up with Dr. Dellis Anes.

## 2023-06-28 NOTE — Progress Notes (Signed)
Dr. Dellis Anes: Mallory Silva Called and spoke with [facility] and she reports that she did pick up the Flovent.  She paid out-of-pocket for Flovent. It was $300.  She did not feel like the Flovent was working well for her.  She had to use her albuterol 3 times yesterday while on the Flovent.  She picked up the Qvar today and reports that it works better and her breathing is much better.  She has not had to use her albuterol today.  Instructed her to let us know if her breathing is not getting any better and to go to urgent care/emergency room.  She verbalizes understanding.  Since avoiding milk/dairy for the past couple days she reports that her itching has gone away.  She is potentially interested in starting Xolair and wants to discuss this more with Dr. Dellis Anes at her next office visit in November.  She would potentially like to start Xolair before the beginning of the year and  a new deductible.

## 2023-07-03 NOTE — Progress Notes (Signed)
She did not mention the cost for Qvar, so I am assuming it is not as expensive as Flovent was.

## 2023-07-06 ENCOUNTER — Other Ambulatory Visit: Payer: Self-pay

## 2023-07-21 LAB — ALLERGEN MILK: Milk IgE: 0.28 kU/L — AB

## 2023-07-21 LAB — SPECIMEN STATUS REPORT

## 2023-08-03 ENCOUNTER — Ambulatory Visit: Payer: BC Managed Care – PPO | Admitting: Allergy & Immunology

## 2023-10-05 ENCOUNTER — Ambulatory Visit: Payer: BC Managed Care – PPO | Admitting: Allergy & Immunology

## 2023-12-18 ENCOUNTER — Telehealth: Payer: Self-pay | Admitting: Allergy & Immunology

## 2023-12-18 NOTE — Telephone Encounter (Signed)
 Patient called stating she is needing a statement of everything she has paid for the year of 2024 for tax reasons.

## 2024-01-25 ENCOUNTER — Other Ambulatory Visit: Payer: Self-pay | Admitting: Family Medicine

## 2024-01-25 ENCOUNTER — Ambulatory Visit
Admission: RE | Admit: 2024-01-25 | Discharge: 2024-01-25 | Disposition: A | Discharge status: Home / Self Care | Source: Ambulatory Visit | Attending: Family Medicine | Admitting: Family Medicine

## 2024-01-25 DIAGNOSIS — L299 Pruritus, unspecified: Secondary | ICD-10-CM

## 2024-04-11 ENCOUNTER — Ambulatory Visit: Admitting: Allergy & Immunology

## 2024-04-11 DIAGNOSIS — J309 Allergic rhinitis, unspecified: Secondary | ICD-10-CM

## 2024-05-14 ENCOUNTER — Ambulatory Visit (INDEPENDENT_AMBULATORY_CARE_PROVIDER_SITE_OTHER): Payer: Self-pay | Admitting: Allergy & Immunology

## 2024-05-14 ENCOUNTER — Other Ambulatory Visit: Payer: Self-pay

## 2024-05-14 ENCOUNTER — Encounter: Payer: Self-pay | Admitting: Allergy & Immunology

## 2024-05-14 VITALS — BP 136/86 | HR 100 | Temp 98.7°F | Wt 203.6 lb

## 2024-05-14 DIAGNOSIS — L500 Allergic urticaria: Secondary | ICD-10-CM | POA: Diagnosis not present

## 2024-05-14 DIAGNOSIS — K219 Gastro-esophageal reflux disease without esophagitis: Secondary | ICD-10-CM | POA: Diagnosis not present

## 2024-05-14 DIAGNOSIS — J4520 Mild intermittent asthma, uncomplicated: Secondary | ICD-10-CM

## 2024-05-14 DIAGNOSIS — T781XXD Other adverse food reactions, not elsewhere classified, subsequent encounter: Secondary | ICD-10-CM | POA: Diagnosis not present

## 2024-05-14 DIAGNOSIS — T781XXA Other adverse food reactions, not elsewhere classified, initial encounter: Secondary | ICD-10-CM

## 2024-05-14 DIAGNOSIS — T7800XD Anaphylactic reaction due to unspecified food, subsequent encounter: Secondary | ICD-10-CM

## 2024-05-14 NOTE — Progress Notes (Signed)
 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  - Daily controller medication(s): NOTHING - Prior to physical activity: albuterol  2 puffs 10-15 minutes before physical activity. - Rescue medications: albuterol  4 puffs every 4-6 hours as needed - Changes during respiratory infections or worsening symptoms: Add on Flovent  two puffs twice daily for 1-2 weeks - Asthma control goals:  * Full participation in all desired activities (may need albuterol  before activity) * Albuterol  use two time or less a week on average (not counting use with activity) * Cough interfering with sleep two time or less a month * Oral steroids no more than once a year * No hospitalizations  2. Hives (urticaria) - seems well-controlled with the current dietary avoidance measures - At the first sign of any hives, start your regimen of antihistamines (titrating while remaining hive free):  Cetirizine  (Zyrtec ) 10mg  twice a day and famotidine  (Pepcid ) 20 mg twice a day. If no symptoms for 7-14 days then decrease to. Cetirizine  (Zyrtec ) 10mg  twice a day and famotidine  (Pepcid ) 20 mg once a day.  If no symptoms for 7-14 days then decrease to. Cetirizine  (Zyrtec ) 10mg  twice a day.  If no symptoms for 7-14 days then decrease to. Cetirizine  (Zyrtec ) 10mg  once a day.  3. Allergic rhinitis - Continue cetirizine  10 mg once a day as needed for runny nose or itch - Continue allergen avoidance measures directed toward pollen, cockroach,  dust mite, and pets   4. Alpha gal allergy  - We are getting repeat labs today. - We will call you in 1-2 weeks with the results of the testing.  - Epinephrine  autoinjector refilled today.   5. Reflux - Continue dietary and lifestyle modifications as listed below - Continue Pepcid  daily for reflux control  6. Return in about 1 year (around 05/14/2025). You can have the follow up appointment with Dr. Iva or a Nurse Practicioner (our Nurse Practitioners are excellent and always have Physician oversight!).   Subjective:   Mallory Silva is a 58 y.o. 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  Discussed the use of AI scribe software for clinical note transcription with the patient and/or guardian, who gave verbal consent to proceed.  Mallory Silva is a 58 y.o. female presenting for a follow up visit.  She was last seen in September 2024 by Wanda Craze.  At that time, she stopped her Trelegy and started Flovent  110  mcg 2 puffs twice daily instead.  She continue with albuterol  as needed.  For her hives, she was encouraged to stop dairy due to her history of alpha gal.  For her reflux, she was continued on Pepcid .  Since last visit, she has done well.  Asthma/Respiratory Symptom History: She has a history of asthma, which has been  exacerbated in the past. However, her asthma symptoms have not been severe this year, attributing improvement to avoiding heat exposure, which exacerbates her symptoms. She has not used Trelegy in over a year due to its high cost and was able to pick up Flovent  despite its expense. She also uses albuterol , which she finds affordable and has not needed to use frequently.  Food Allergy  Symptom History: She has a history of alpha-gal allergy , which has caused itching, particularly associated with milk consumption. She has switched to almond milk and butter as substitutes and avoids cheese, especially vegan cheese, which she finds unsatisfactory. She avoids red meat due to her alpha-gal allergy . She has adapted her diet to exclude red meat.  Infection Symptom History: She mentions that she has not had any sinus infections since her last visit and has been generally healthy, aside from an occasional stomach bug. She does not take antihistamines daily and has not needed to use an EpiPen  recently.   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  Otherwise, there have been no changes to her past medical history, surgical history, family history, or social history.    Review of systems otherwise negative other than that mentioned in the HPI.    Objective:   Blood pressure 136/86, pulse 100, temperature 98.7 F (37.1 C), weight 203 lb 9.6 oz (92.4 kg), SpO2 97%. Body mass index is 41.12 kg/m.    Physical Exam Vitals reviewed.  Constitutional:      Appearance: She is well-developed.  HENT:     Head: Normocephalic and atraumatic.     Right Ear: Tympanic membrane, ear canal and external ear normal.     Left Ear: Tympanic membrane, ear canal and external ear normal.     Nose: No nasal deformity, septal deviation, mucosal edema or rhinorrhea.     Right Turbinates: Enlarged, swollen and pale.     Left Turbinates: Enlarged, swollen and pale.     Right Sinus: No maxillary  sinus tenderness or frontal sinus tenderness.     Left Sinus: No maxillary sinus tenderness or frontal sinus tenderness.     Mouth/Throat:     Mouth: Mucous membranes are not pale and not dry.     Pharynx: Uvula midline.  Eyes:     General:        Right eye: No discharge.        Left eye: No discharge.     Conjunctiva/sclera: Conjunctivae normal.     Right eye: Right conjunctiva is not injected. No chemosis.    Left eye: Left conjunctiva is not injected. No chemosis.    Pupils: Pupils are equal, round, and reactive to light.  Cardiovascular:     Rate and Rhythm: Normal rate and regular rhythm.     Heart sounds: Normal heart sounds.  Pulmonary:     Effort: Pulmonary effort is normal. No tachypnea, accessory muscle usage or respiratory distress.     Breath sounds: Normal breath sounds. No wheezing, rhonchi or rales.  Chest:     Chest wall: No tenderness.  Lymphadenopathy:     Cervical: No cervical adenopathy.  Skin:    General: Skin is warm.     Capillary Refill: Capillary refill takes less than 2 seconds.     Coloration: Skin is not pale.     Findings: No abrasion, erythema, petechiae or rash. Rash is not papular, urticarial or vesicular.     Comments: Skin appears clear.   Neurological:     Mental Status: She is alert.  Psychiatric:        Behavior: Behavior is cooperative.      Diagnostic studies:    Spirometry: results normal (FEV1: 1.76/84%, FVC: 2.26/86%, FEV1/FVC: 78%).    Spirometry consistent with normal pattern.   Allergy  Studies: none       Marty Shaggy, MD  [facility] 

## 2024-05-14 NOTE — Addendum Note (Signed)
 Addended by: NANCEE JON SAILOR on: 05/14/2024 03:39 PM   Modules accepted: Orders

## 2024-05-14 NOTE — Patient Instructions (Addendum)
 Asthma - Lung testing looks excellent today.  - Daily controller medication(s): NOTHING - Prior to physical activity: albuterol  2 puffs 10-15 minutes before physical activity. - Rescue medications: albuterol  4 puffs every 4-6 hours as needed - Changes during respiratory infections or worsening symptoms: Add on Flovent  two puffs twice daily for 1-2 weeks - Asthma control goals:  * Full participation in all desired activities (may need albuterol  before activity) * Albuterol  use two time or less a week on average (not counting use with activity) * Cough interfering with sleep two time or less a month * Oral steroids no more than once a year * No hospitalizations  2. Hives (urticaria) - seems well-controlled with the current dietary avoidance measures - At the first sign of any hives, start your regimen of antihistamines (titrating while remaining hive free):  Cetirizine  (Zyrtec ) 10mg  twice a day and famotidine  (Pepcid ) 20 mg twice a day. If no symptoms for 7-14 days then decrease to. Cetirizine  (Zyrtec ) 10mg  twice a day and famotidine  (Pepcid ) 20 mg once a day.  If no symptoms for 7-14 days then decrease to. Cetirizine  (Zyrtec ) 10mg  twice a day.  If no symptoms for 7-14 days then decrease to. Cetirizine  (Zyrtec ) 10mg  once a day.  3. Allergic rhinitis - Continue cetirizine  10 mg once a day as needed for runny nose or itch - Continue allergen avoidance measures directed toward pollen, cockroach, dust mite, and pets   4. Alpha gal allergy  - We are getting repeat labs today. - We will call you in 1-2 weeks with the results of the testing.  - Epinephrine  autoinjector refilled today.   5. Reflux - Continue dietary and lifestyle modifications as listed below - Continue Pepcid  daily for reflux control  6. Return in about 1 year (around 05/14/2025). You can have the follow up appointment with Dr. Iva or a Nurse Practicioner (our Nurse Practitioners are excellent and always have Physician  oversight!).    Please inform us  of any Emergency Department visits, hospitalizations, or changes in symptoms. Call us  before going to the ED for breathing or allergy  symptoms since we might be able to fit you in for a sick visit. Feel free to contact us  anytime with any questions, problems, or concerns.  It was a pleasure to see you again today!  Websites that have reliable patient information: 1. American Academy of Asthma, Allergy , and Immunology: www.aaaai.org 2. Food Allergy  Research and Education (FARE): foodallergy.org 3. Mothers of Asthmatics: http://www.asthmacommunitynetwork.org 4. American College of Allergy , Asthma, and Immunology: www.acaai.org      "Like" us  on Facebook and Instagram for our latest updates!      A healthy democracy works best when [employer] participate! Make sure you are registered to vote! If you have moved or changed any of your contact information, you will need to get this updated before voting! Scan the QR codes below to learn more!

## 2024-05-17 LAB — ALPHA-GAL PANEL
Allergen Lamb IgE: 3.88 kU/L — AB
Beef IgE: 4.27 kU/L — AB
IgE (Immunoglobulin E), Serum: 1513 [IU]/mL — ABNORMAL HIGH (ref 6–495)
O215-IgE Alpha-Gal: 9.03 kU/L — AB
Pork IgE: 2.36 kU/L — AB

## 2024-05-20 ENCOUNTER — Ambulatory Visit (INDEPENDENT_AMBULATORY_CARE_PROVIDER_SITE_OTHER): Admitting: Podiatry

## 2024-05-20 DIAGNOSIS — Z91199 Patient's noncompliance with other medical treatment and regimen due to unspecified reason: Secondary | ICD-10-CM

## 2024-05-20 NOTE — Progress Notes (Signed)
 No show

## 2024-05-24 ENCOUNTER — Ambulatory Visit: Payer: Self-pay | Admitting: Allergy & Immunology

## 2024-05-24 DIAGNOSIS — R768 Other specified abnormal immunological findings in serum: Secondary | ICD-10-CM

## 2024-06-04 NOTE — Telephone Encounter (Signed)
 PT called back, I provided message from Dr Iva and confirmed to PT that he has blood/urine tests sent in for her to take. She stated probably could not come in today, but should be able to get in by end of the week

## 2024-09-17 ENCOUNTER — Telehealth: Payer: Self-pay

## 2024-09-17 NOTE — Telephone Encounter (Signed)
 Macario from [facility] called because they sent a referral for an echo on this patient but [employer] states that they are not in network with us . She wants to speak to someone regarding this and what the patients options are. Please advise.

## 2024-09-25 NOTE — Telephone Encounter (Signed)
 Macario from [facility] calling again trying to schedule echo for pt. Made her aware there is no order, she was very upset and asked to speak with someone else. Hung up while on hold. Please advise.

## 2024-09-25 NOTE — Telephone Encounter (Signed)
 Attempted to contact primary care office for clarification on this matter. No answer.

## 2024-09-30 NOTE — Telephone Encounter (Signed)
 Macario from [facility] requesting a c/b in regards to a Echo order

## 2024-09-30 NOTE — Telephone Encounter (Signed)
 Spoke with [employer]. Mallory Silva states she sent a fax 09/11/24 that was successful. [place] that she has spoken to someone whom schedules the echos that said they could see the order and prior auth and advised her to have the patient call and set up the appointment for the echo. Mallory Silva states that when Pt called she was told there was no order and could not schedule. This RN also sees no order. Mallory Silva said she was advised by someone at our office that the prior auth runs out on the 13th. Myles Mallory Silva I would forward the message over to Olam Ford to see if she can help.

## 2024-10-02 ENCOUNTER — Other Ambulatory Visit (HOSPITAL_COMMUNITY): Payer: Self-pay | Admitting: Family Medicine

## 2024-10-02 ENCOUNTER — Ambulatory Visit (HOSPITAL_COMMUNITY)
Admission: RE | Admit: 2024-10-02 | Discharge: 2024-10-02 | Disposition: A | Discharge status: Home / Self Care | Source: Ambulatory Visit | Attending: Internal Medicine | Admitting: Internal Medicine

## 2024-10-02 DIAGNOSIS — R0609 Other forms of dyspnea: Secondary | ICD-10-CM | POA: Insufficient documentation

## 2024-10-02 DIAGNOSIS — R7989 Other specified abnormal findings of blood chemistry: Secondary | ICD-10-CM

## 2024-10-02 LAB — ECHOCARDIOGRAM COMPLETE
Area-P 1/2: 5.66 cm2
S' Lateral: 3.07 cm

## 2024-10-04 ENCOUNTER — Encounter (HOSPITAL_COMMUNITY): Payer: Self-pay | Admitting: Family Medicine

## 2024-10-04 DIAGNOSIS — R0600 Dyspnea, unspecified: Secondary | ICD-10-CM
